# Patient Record
Sex: Female | Born: 1970 | Race: White | Hispanic: No | Marital: Married | Smoking: Light tobacco smoker
Health system: Southern US, Community
[De-identification: ages and names within clinical notes are randomized; demographics above are authoritative.]

## PROBLEM LIST (undated history)

## (undated) DIAGNOSIS — E119 Type 2 diabetes mellitus without complications: Secondary | ICD-10-CM

## (undated) DIAGNOSIS — I10 Essential (primary) hypertension: Secondary | ICD-10-CM

## (undated) DIAGNOSIS — R87619 Unspecified abnormal cytological findings in specimens from cervix uteri: Secondary | ICD-10-CM

## (undated) HISTORY — PX: GYNECOLOGIC CRYOSURGERY: SHX857

## (undated) HISTORY — PX: DILATION AND CURETTAGE OF UTERUS: SHX78

## (undated) HISTORY — DX: Type 2 diabetes mellitus without complications: E11.9

## (undated) HISTORY — DX: Unspecified abnormal cytological findings in specimens from cervix uteri: R87.619

## (undated) HISTORY — PX: ABDOMINAL HYSTERECTOMY: SHX81

## (undated) HISTORY — PX: WISDOM TOOTH EXTRACTION: SHX21

## (undated) HISTORY — DX: Essential (primary) hypertension: I10

---

## 2005-06-09 ENCOUNTER — Other Ambulatory Visit: Admission: RE | Admit: 2005-06-09 | Discharge: 2005-06-09 | Payer: Self-pay | Admitting: Gynecology

## 2011-06-27 ENCOUNTER — Other Ambulatory Visit: Payer: Self-pay | Admitting: Family Medicine

## 2011-06-27 DIAGNOSIS — Z1231 Encounter for screening mammogram for malignant neoplasm of breast: Secondary | ICD-10-CM

## 2011-07-04 ENCOUNTER — Ambulatory Visit
Admission: RE | Admit: 2011-07-04 | Discharge: 2011-07-04 | Disposition: A | Discharge status: Home / Self Care | Payer: BC Managed Care – PPO | Source: Ambulatory Visit | Attending: Family Medicine | Admitting: Family Medicine

## 2011-07-04 DIAGNOSIS — Z1231 Encounter for screening mammogram for malignant neoplasm of breast: Secondary | ICD-10-CM

## 2012-12-27 ENCOUNTER — Other Ambulatory Visit (HOSPITAL_COMMUNITY)
Admission: RE | Admit: 2012-12-27 | Discharge: 2012-12-27 | Disposition: A | Discharge status: Home / Self Care | Payer: BC Managed Care – PPO | Source: Ambulatory Visit | Attending: Family Medicine | Admitting: Family Medicine

## 2012-12-27 ENCOUNTER — Other Ambulatory Visit: Payer: Self-pay | Admitting: Physician Assistant

## 2012-12-27 DIAGNOSIS — Z01419 Encounter for gynecological examination (general) (routine) without abnormal findings: Secondary | ICD-10-CM | POA: Insufficient documentation

## 2014-10-08 ENCOUNTER — Ambulatory Visit
Admission: RE | Admit: 2014-10-08 | Discharge: 2014-10-08 | Disposition: A | Discharge status: Home / Self Care | Payer: Self-pay | Source: Ambulatory Visit | Attending: Family Medicine | Admitting: Family Medicine

## 2014-10-08 ENCOUNTER — Other Ambulatory Visit: Payer: Self-pay | Admitting: Family Medicine

## 2014-10-08 DIAGNOSIS — R1032 Left lower quadrant pain: Secondary | ICD-10-CM

## 2014-10-08 MED ORDER — IOHEXOL 300 MG/ML  SOLN
75.0000 mL | Freq: Once | INTRAMUSCULAR | Status: AC | PRN
  Administered 2014-10-08: 75 mL via INTRAVENOUS

## 2014-10-10 ENCOUNTER — Other Ambulatory Visit: Payer: Self-pay | Admitting: Family Medicine

## 2014-10-10 DIAGNOSIS — N838 Other noninflammatory disorders of ovary, fallopian tube and broad ligament: Secondary | ICD-10-CM

## 2014-10-13 ENCOUNTER — Ambulatory Visit
Admission: RE | Admit: 2014-10-13 | Discharge: 2014-10-13 | Disposition: A | Discharge status: Home / Self Care | Payer: BLUE CROSS/BLUE SHIELD | Source: Ambulatory Visit | Attending: Family Medicine | Admitting: Family Medicine

## 2014-10-13 DIAGNOSIS — N838 Other noninflammatory disorders of ovary, fallopian tube and broad ligament: Secondary | ICD-10-CM

## 2014-10-14 ENCOUNTER — Other Ambulatory Visit: Payer: Self-pay | Admitting: Family Medicine

## 2014-10-14 DIAGNOSIS — N83202 Unspecified ovarian cyst, left side: Secondary | ICD-10-CM

## 2014-11-25 ENCOUNTER — Ambulatory Visit
Admission: RE | Admit: 2014-11-25 | Discharge: 2014-11-25 | Disposition: A | Discharge status: Home / Self Care | Payer: BLUE CROSS/BLUE SHIELD | Source: Ambulatory Visit | Attending: Family Medicine | Admitting: Family Medicine

## 2014-11-25 DIAGNOSIS — N83202 Unspecified ovarian cyst, left side: Secondary | ICD-10-CM

## 2014-12-01 ENCOUNTER — Other Ambulatory Visit: Payer: Self-pay | Admitting: Family Medicine

## 2014-12-01 DIAGNOSIS — N838 Other noninflammatory disorders of ovary, fallopian tube and broad ligament: Secondary | ICD-10-CM

## 2014-12-10 ENCOUNTER — Ambulatory Visit
Admission: RE | Admit: 2014-12-10 | Discharge: 2014-12-10 | Disposition: A | Discharge status: Home / Self Care | Payer: BLUE CROSS/BLUE SHIELD | Source: Ambulatory Visit | Attending: Family Medicine | Admitting: Family Medicine

## 2014-12-10 DIAGNOSIS — N838 Other noninflammatory disorders of ovary, fallopian tube and broad ligament: Secondary | ICD-10-CM

## 2014-12-10 MED ORDER — GADOBENATE DIMEGLUMINE 529 MG/ML IV SOLN
16.0000 mL | Freq: Once | INTRAVENOUS | Status: AC | PRN
  Administered 2014-12-10: 16 mL via INTRAVENOUS

## 2014-12-16 ENCOUNTER — Other Ambulatory Visit: Payer: Self-pay | Admitting: Family Medicine

## 2014-12-16 DIAGNOSIS — N80129 Deep endometriosis of ovary, unspecified ovary: Secondary | ICD-10-CM

## 2014-12-16 DIAGNOSIS — N809 Endometriosis, unspecified: Secondary | ICD-10-CM

## 2014-12-23 ENCOUNTER — Encounter: Payer: Self-pay | Admitting: Obstetrics & Gynecology

## 2014-12-23 ENCOUNTER — Ambulatory Visit (INDEPENDENT_AMBULATORY_CARE_PROVIDER_SITE_OTHER): Payer: BLUE CROSS/BLUE SHIELD | Admitting: Obstetrics & Gynecology

## 2014-12-23 VITALS — BP 124/74 | HR 64 | Resp 16 | Ht 64.5 in | Wt 179.8 lb

## 2014-12-23 DIAGNOSIS — N801 Endometriosis of ovary: Secondary | ICD-10-CM | POA: Diagnosis not present

## 2014-12-23 DIAGNOSIS — N80129 Deep endometriosis of ovary, unspecified ovary: Secondary | ICD-10-CM

## 2014-12-23 DIAGNOSIS — Z124 Encounter for screening for malignant neoplasm of cervix: Secondary | ICD-10-CM

## 2014-12-23 NOTE — Progress Notes (Signed)
44 y.o. G2P2 MarriedCaucasianF here for consultation regarding left lower quadrant pain that has been present for greater than three months, enlarged uterus, and possible endometrioma noted on CT, ultrasound, and MRI.      Pt reports she has a long history of heavy and irregular cycles.  Pt was on OCPs in college and in her early 20's.  At one point, she recalls being on a higher dosage OCP and skipped her cycles for a year.  She had a lot of headaches with this and ultimately stopped.  She did not have trouble conceiving and had her first pregnancy at age 20 and second one age 69.  She had vaginal deliveries and both children were around 7 pounds each.  Pt reports about three to four months ago, she started experiencing increased LLQ pain as well as worsening pain with her cycles. Hse reports her flow isn't any different but continues to be heavy.  The pain is concentrated on the left with radiation down and around to the left front.  This is worse in the few days before her cycle but it present all the time.  She was prescribed Mobic for this but doesn't think it has helped so she is only taking tylenol and advil for the pain.    Pt reports she went to see Dr. Kathyrn Lass.  Evaluation included blood work showing an elevated WBC ct.  She did not have a fever at the time.  CT was done to rule out diverticulitis.  CT showed 7.4 x 5.3 x 5.7cm left, cystic adnexal mass.  Uterine fibroid was noted measuring 3.5cm as well. Pt was started on antibiotics due to the elevated WBC ct.  Pt stayed on this for a few days until ultrasound was done howing/confirming the left adnexal mass.    She also had an enlarged 16 week uterus.    Ultrasound was then repeated in early March confirming presence of an enlarging almost 8cm complex mass.  Then MRI was done 12/10/14 showing probable endometrioma.   She states she requested a referral for recommendations and is here to discuss options.  She is tired of being in pain and is  ready to proceed with some treatment other than monitoring the adnexal mass.     Patient's last menstrual period was 11/27/2014.          Sexually active: Yes.    The current method of family planning is vasectomy.    Exercising: Yes.    bootcamp and yoga Smoker:  Occasional cigar smoker  Health Maintenance: Pap:  2014 History of abnormal Pap:  Yes in college-cryosurgery MMG:  07/04/11-normal Colonoscopy:  none BMD:   none TDaP:  Will check with PCP Screening Labs: PCP, Hb today: PCP, Urine today: n/a   reports that she has been smoking Cigars.  She has never used smokeless tobacco. She reports that she drinks about 0.6 - 1.2 oz of alcohol per week. She reports that she does not use illicit drugs.  Past Medical History  Diagnosis Date  . Hypertension   . Abnormal Pap smear of cervix     in college-had cryosurgery    Past Surgical History  Procedure Laterality Date  . Wisdom tooth extraction    . Gynecologic cryosurgery      in college    Current Outpatient Prescriptions  Medication Sig Dispense Refill  . acetaminophen (TYLENOL) 500 MG tablet Take 500 mg by mouth every 6 (six) hours as needed.    . fexofenadine (  ALLEGRA) 180 MG tablet Take 180 mg by mouth daily.    Marland Kitchen ibuprofen (ADVIL,MOTRIN) 200 MG tablet Take 200 mg by mouth every 6 (six) hours as needed.    Marland Kitchen lisinopril-hydrochlorothiazide (PRINZIDE,ZESTORETIC) 10-12.5 MG per tablet   5  . Psyllium (METAMUCIL PO) Take by mouth daily.    . meloxicam (MOBIC) 15 MG tablet As needed  0   No current facility-administered medications for this visit.    Family History  Problem Relation Age of Onset  . Adopted: Yes    ROS:  Pertinent items are noted in HPI.  Otherwise, a comprehensive ROS was negative.  Exam:   BP 124/74 mmHg  Pulse 64  Resp 16  Ht 5' 4.5" (1.638 m)  Wt 179 lb 12.8 oz (81.557 kg)  BMI 30.40 kg/m2  LMP 11/27/2014     Height: 5' 4.5" (163.8 cm)  Ht Readings from Last 3 Encounters:  12/23/14 5' 4.5"  (1.638 m)    General appearance: alert, cooperative and appears stated age Head: Normocephalic, without obvious abnormality, atraumatic Neck: no adenopathy, supple, symmetrical, trachea midline and thyroid normal to inspection and palpation Breasts: normal appearance, no masses or tenderness Abdomen: soft, non-tender; bowel sounds normal; no masses,  no organomegaly Extremities: extremities normal, atraumatic, no cyanosis or edema Skin: Skin color, texture, turgor normal. No rashes or lesions Lymph nodes: Cervical, supraclavicular, and axillary nodes normal. No abnormal inguinal nodes palpated Neurologic: Grossly normal   Pelvic: External genitalia:  no lesions              Urethra:  normal appearing urethra with no masses, tenderness or lesions              Bartholins and Skenes: normal                 Vagina: normal appearing vagina with normal color and discharge, no lesions              Cervix: no lesions              Pap taken: Yes.   Bimanual Exam:  Uterus:  enlarged, 14 weeks, off to left, mobile weeks size              Adnexa: 8 cm firm mass left adnexa, tenderness to palpation, no mass on right               Rectovaginal: Confirms               Anus:  normal sphincter tone, no lesions  Chaperone was present for exam.  A:  8cm right endometrioma with enlarged 16cm uterus containing a 3.5cm fibroid and probable adenomyosis Hypertension Dysmenorrhea Menorrhagia  P:   With enlarged uterus and enlarging probable endometrioma, hysterectomy with LSO/possible BSO discussed.  Pt is not interested in OCPs or other hormonal methods.  She is also most interested in definitive therapy.  Surgery, recover, hospital stay, as well as risks including but not limited to bleeding, transfusion, DVT/PE, bowel/bladder/ureteral injury all discussed.  Pt also aware she may need to have both ovaries removed and be on HRT.  She is ok with all of this.   pap smear with HR HPV was obtained  today Ca-125 pending. Pt wants to discuss with her spouse and work and will call back for scheduling.

## 2014-12-24 ENCOUNTER — Telehealth: Payer: Self-pay | Admitting: Obstetrics & Gynecology

## 2014-12-24 ENCOUNTER — Telehealth: Payer: Self-pay | Admitting: Emergency Medicine

## 2014-12-24 LAB — CA 125: CA 125: 93 U/mL — ABNORMAL HIGH (ref <35)

## 2014-12-24 NOTE — Telephone Encounter (Signed)
Spoke with patient. Advised of benefit quote received for the surgeon portion of her surgery. Advised that payment is due in full at least 3 weeks prior to the scheduled surgery date. Patient agreeable. Advised patient that she will receive separate communication from the hospital regarding facility charges/fees/payment. Patient is agreeable with scheduling for 04.18.2016. Will notify Gay Filler.

## 2014-12-24 NOTE — Telephone Encounter (Signed)
Called [employer] Utilization Management to initiate prior auth for upcoming surgery. [id number].  Date:  03.30.2016 Time: 1219 Rep: Margarita Grizzle  CPT code: 27078 DX codes: N80.9/D25.9 Date of service: 04.18.2016 2 day surgery admit  Pending auth # [id number]  Was transferred to Nurse Phoebe Sharps who reviewed all case information with me and approved the surgery for a 2 day stay.  If additional days are needed, hospital will need to contact Case manager Devin J  [phone] ext [phone]. Also if additional days are needed, clinicals should be faxed to  [phone] Attn: auth# [id number].  Ref # for call is the auth #

## 2014-12-24 NOTE — Telephone Encounter (Signed)
Call to patient to give pre op instructions and schedule pre-op and post op appointments.  Patient given appointments and instructions. She is coming on 12/29/14 with husband for pre-op advised would need to confirm day of pre op regarding need for bowel prep if any.   Instructions given as follows:  Beginning 14 days before surgery, we ask that you do not take the following medications:  Aspirin, Vitamin E, NSAIDS (like Advil, Aleve, Motrin, Ibuprofen), Fish Oil, Herbal supplements.  You can take Tylenol or acetaminophen for any discomfort.  Do not take any other pain medications unless approved by your physician.  You may continue your regular multi-vitamin. No bowel prep needed.  DO NOT EAT OR DRINK ANYTHING AFTER MIDNIGHT ON THE NIGHT BEFORE YOUR SURGERY (INCLUDING WATER) UNLESS ADVISED TO DO SO BY THE HOSPITAL OR YOUR PHYSICIAN.  Dress casually the morning of surgery.  Do not take any valuables with you and do not wear makeup, jewelry or lotion.  You should not shave 48 hours prior to surgery.  Patient verbalized understanding of all instructions. She is scheduled for preop exam at [facility] hospital.

## 2014-12-26 ENCOUNTER — Telehealth: Payer: Self-pay | Admitting: Obstetrics & Gynecology

## 2014-12-26 NOTE — Telephone Encounter (Signed)
Disability forms received via fax. Forwarded to [employer].

## 2014-12-29 ENCOUNTER — Ambulatory Visit (INDEPENDENT_AMBULATORY_CARE_PROVIDER_SITE_OTHER): Payer: BLUE CROSS/BLUE SHIELD | Admitting: Obstetrics & Gynecology

## 2014-12-29 ENCOUNTER — Ambulatory Visit: Payer: BLUE CROSS/BLUE SHIELD | Admitting: Obstetrics & Gynecology

## 2014-12-29 VITALS — BP 120/70 | HR 60 | Resp 16 | Wt 178.2 lb

## 2014-12-29 DIAGNOSIS — N920 Excessive and frequent menstruation with regular cycle: Secondary | ICD-10-CM | POA: Diagnosis not present

## 2014-12-29 DIAGNOSIS — R102 Pelvic and perineal pain: Secondary | ICD-10-CM

## 2014-12-29 DIAGNOSIS — N801 Endometriosis of ovary: Secondary | ICD-10-CM

## 2014-12-29 DIAGNOSIS — N80129 Deep endometriosis of ovary, unspecified ovary: Secondary | ICD-10-CM

## 2014-12-29 LAB — IPS PAP TEST WITH HPV

## 2014-12-29 MED ORDER — OXYCODONE-ACETAMINOPHEN 5-325 MG PO TABS: 2.0000 | ORAL_TABLET | ORAL | Status: DC | PRN

## 2014-12-29 MED ORDER — IBUPROFEN 800 MG PO TABS: 800.0000 mg | ORAL_TABLET | Freq: Three times a day (TID) | ORAL | Status: DC | PRN

## 2014-12-29 NOTE — Telephone Encounter (Signed)
Routing to provider for final review. Patient agreeable to disposition. Will close encounter.     

## 2014-12-29 NOTE — Progress Notes (Signed)
44 y.o. G2P2 MarriedCaucasian female here for discussion of upcoming procedure.  Robotic assisted TLH with RSO, possible LSO, possible TAH, cystoscopy, pelvic washings planned due to 8cm left endometrioma on CT, ultrasound and MRI as well as menorrhagia and chronic pelvic pain.  Pt using around the close anti-inflammatory medications at this time.  Pre-op evaluation thus far has included CT, two ultrasounds and MRI (all ordered by her PCP).  MRI was done 12/10/14 and showed endometrioma with no concerns for malignancy.  Cas-125 is 93.  Pap done 12/23/14 was negative.    Spouse with pt today.  Procedure discussed with patient.  Hospital stay, recovery and pain management all discussed.  Risks discussed including but not limited to bleeding, 1% risk of receiving a  transfusion, infection, 3-4% risk of bowel/bladder/ureteral/vascular injury discussed as well as possible need for additional surgery if injury does occur discussed.  DVT/PE and rare risk of death discussed.  My actual complications with prior surgeries discussed.  Vaginal cuff dehiscence discussed.  Hernia formation discussed.  Positioning and incision locations discussed.  Patient aware if pathology abnormal she may need additional treatment.  As well, procedure will be started at laparoscopy but could be converted to laparotomy.  Both pt and spouse are comfortable with this plan.   All questions answered.       Ob Hx:   Patient's last menstrual period was 11/27/2014.          Sexually active: Yes.   Birth control: vasectomy Last pap: 12/23/14 WNL/negative HR HPV Last MMG:07/04/11 normal Tobacco: no smoking  Past Surgical History  Procedure Laterality Date  . Wisdom tooth extraction    . Gynecologic cryosurgery      in college  . Dilation and curettage of uterus      Dr. Janese Banks    Past Medical History  Diagnosis Date  . Hypertension   . Abnormal Pap smear of cervix     in college-had cryosurgery    Allergies: Review of patient's  allergies indicates no known allergies.  Current Outpatient Prescriptions  Medication Sig Dispense Refill  . acetaminophen (TYLENOL) 500 MG tablet Take 500 mg by mouth every 6 (six) hours as needed.    . fexofenadine (ALLEGRA) 180 MG tablet Take 180 mg by mouth daily.    Marland Kitchen ibuprofen (ADVIL,MOTRIN) 200 MG tablet Take 200 mg by mouth every 6 (six) hours as needed.    Marland Kitchen lisinopril-hydrochlorothiazide (PRINZIDE,ZESTORETIC) 10-12.5 MG per tablet   5  . Psyllium (METAMUCIL PO) Take by mouth daily.     No current facility-administered medications for this visit.    ROS: A comprehensive review of systems was negative except for: Genitourinary: positive for LLQ pain  Exam:    LMP 11/27/2014  General appearance: alert and cooperative Head: Normocephalic, without obvious abnormality, atraumatic Neck: no adenopathy, supple, symmetrical, trachea midline and thyroid not enlarged, symmetric, no tenderness/mass/nodules Lungs: clear to auscultation bilaterally Heart: regular rate and rhythm, S1, S2 normal, no murmur, click, rub or gallop Abdomen: soft, non-tender; bowel sounds normal; no masses,  no organomegaly Extremities: extremities normal, atraumatic, no cyanosis or edema Skin: Skin color, texture, turgor normal. No rashes or lesions Lymph nodes: Cervical, supraclavicular, and axillary nodes normal. no inguinal nodes palpated Neurologic: Grossly normal  Pelvic: exam was done 12/23/14 so was not repeated today  A: Endometriosis, Pelvic Pain, menorrhagia, 8cm left endometrioma     P:  Robotic TLH/LSO/possible RSO/possible right salpingectomy/cystoscopy/pelvic washings/possible TAH planned Rx for Motrin and Percocet given. Medications/Vitamins reviewed.  Pt knows needs to stop anti-inflammatories if she can. Hysterectomy brochure given for pre and post op instructions.  ~30 minutes spent with patient >50% of time was in face to face discussion of above.  This is only the second time I have  seen this pt and I met her husband for the first time today.  They had many and appropriate questions which were all answered.

## 2015-01-02 ENCOUNTER — Other Ambulatory Visit: Payer: Self-pay | Admitting: Obstetrics & Gynecology

## 2015-01-04 ENCOUNTER — Encounter: Payer: Self-pay | Admitting: Obstetrics & Gynecology

## 2015-01-04 DIAGNOSIS — N80129 Deep endometriosis of ovary, unspecified ovary: Secondary | ICD-10-CM | POA: Insufficient documentation

## 2015-01-04 DIAGNOSIS — N920 Excessive and frequent menstruation with regular cycle: Secondary | ICD-10-CM | POA: Insufficient documentation

## 2015-01-04 DIAGNOSIS — R102 Pelvic and perineal pain: Secondary | ICD-10-CM | POA: Insufficient documentation

## 2015-01-04 DIAGNOSIS — N801 Endometriosis of ovary: Secondary | ICD-10-CM | POA: Insufficient documentation

## 2015-01-05 ENCOUNTER — Other Ambulatory Visit: Payer: Self-pay | Admitting: Obstetrics & Gynecology

## 2015-01-05 ENCOUNTER — Telehealth: Payer: Self-pay | Admitting: *Deleted

## 2015-01-05 NOTE — Telephone Encounter (Signed)
Orders completed.  Ok to close encounter.  Thanks.

## 2015-01-05 NOTE — Telephone Encounter (Signed)
Amy from hospital PAT department called requesting orders for patient's surgery on 01-12-15. Has PAT appointment tomorrow.

## 2015-01-06 ENCOUNTER — Encounter (HOSPITAL_COMMUNITY): Payer: Self-pay

## 2015-01-06 ENCOUNTER — Encounter (HOSPITAL_COMMUNITY)
Admission: RE | Admit: 2015-01-06 | Discharge: 2015-01-06 | Disposition: A | Discharge status: Home / Self Care | Payer: BLUE CROSS/BLUE SHIELD | Source: Ambulatory Visit | Attending: Obstetrics & Gynecology | Admitting: Obstetrics & Gynecology

## 2015-01-06 ENCOUNTER — Other Ambulatory Visit: Payer: Self-pay

## 2015-01-06 DIAGNOSIS — D259 Leiomyoma of uterus, unspecified: Secondary | ICD-10-CM | POA: Insufficient documentation

## 2015-01-06 DIAGNOSIS — Z01818 Encounter for other preprocedural examination: Secondary | ICD-10-CM | POA: Insufficient documentation

## 2015-01-06 DIAGNOSIS — N809 Endometriosis, unspecified: Secondary | ICD-10-CM | POA: Insufficient documentation

## 2015-01-06 LAB — BASIC METABOLIC PANEL
Anion gap: 7 (ref 5–15)
BUN: 14 mg/dL (ref 6–23)
CO2: 26 mmol/L (ref 19–32)
Calcium: 9.3 mg/dL (ref 8.4–10.5)
Chloride: 104 mmol/L (ref 96–112)
Creatinine, Ser: 0.58 mg/dL (ref 0.50–1.10)
GFR calc Af Amer: >90 mL/min (ref >90)
GFR calc non Af Amer: >90 mL/min (ref >90)
GLUCOSE: 88 mg/dL (ref 70–99)
POTASSIUM: 3.8 mmol/L (ref 3.5–5.1)
SODIUM: 137 mmol/L (ref 135–145)

## 2015-01-06 LAB — CBC
HEMATOCRIT: 38.9 % (ref 36.0–46.0)
HEMOGLOBIN: 13.1 g/dL (ref 12.0–15.0)
MCH: 30.3 pg (ref 26.0–34.0)
MCHC: 33.7 g/dL (ref 30.0–36.0)
MCV: 90 fL (ref 78.0–100.0)
Platelets: 226 10*3/uL (ref 150–400)
RBC: 4.32 MIL/uL (ref 3.87–5.11)
RDW: 13.2 % (ref 11.5–15.5)
WBC: 8.2 10*3/uL (ref 4.0–10.5)

## 2015-01-06 NOTE — Patient Instructions (Signed)
Your procedure is scheduled on:01/12/15  Enter through the Main Entrance at :6am Pick up desk phone and dial [phone] and inform us of your arrival.  Please call [phone] if you have any problems the morning of surgery.  Remember: Do not eat food or drink liquids, including water, after midnight:Sunday   You may brush your teeth the morning of surgery.  Take these meds the morning of surgery with a sip of water- BP med  DO NOT wear jewelry, eye make-up, lipstick,body lotion, or dark fingernail polish.  (Polished toes are ok) You may wear deodorant.  If you are to be admitted after surgery, leave suitcase in car until your room has been assigned. Patients discharged on the day of surgery will not be allowed to drive home. Wear loose fitting, comfortable clothes for your ride home.

## 2015-01-11 MED ORDER — DEXTROSE 5 % IV SOLN
2.0000 g | INTRAVENOUS | Status: AC
  Administered 2015-01-12: 2 g via INTRAVENOUS
  Filled 2015-01-11: qty 2

## 2015-01-11 NOTE — Anesthesia Preprocedure Evaluation (Addendum)
Anesthesia Evaluation  Patient identified by MRN, date of birth, ID band Patient awake    Reviewed: Allergy & Precautions, NPO status , Patient's Chart, lab work & pertinent test results  History of Anesthesia Complications Negative for: history of anesthetic complications  Airway Mallampati: II  TM Distance: >3 FB Neck ROM: Full    Dental no notable dental hx. (+) Dental Advisory Given   Pulmonary Current Smoker (rare cigar use),  breath sounds clear to auscultation  Pulmonary exam normal       Cardiovascular hypertension, Pt. on medications Rhythm:Regular Rate:Normal     Neuro/Psych negative neurological ROS  negative psych ROS   GI/Hepatic negative GI ROS, Neg liver ROS,   Endo/Other  obesity  Renal/GU negative Renal ROS  negative genitourinary   Musculoskeletal negative musculoskeletal ROS (+)   Abdominal   Peds negative pediatric ROS (+)  Hematology negative hematology ROS (+)   Anesthesia Other Findings   Reproductive/Obstetrics negative OB ROS                            Anesthesia Physical Anesthesia Plan  ASA: II  Anesthesia Plan: General   Post-op Pain Management:    Induction: Intravenous  Airway Management Planned: Oral ETT  Additional Equipment:   Intra-op Plan:   Post-operative Plan: Extubation in OR  Informed Consent: I have reviewed the patients History and Physical, chart, labs and discussed the procedure including the risks, benefits and alternatives for the proposed anesthesia with the patient or authorized representative who has indicated his/her understanding and acceptance.   Dental advisory given  Plan Discussed with: CRNA  Anesthesia Plan Comments:         Anesthesia Quick Evaluation

## 2015-01-12 ENCOUNTER — Inpatient Hospital Stay (HOSPITAL_COMMUNITY)
Admission: RE | Admit: 2015-01-12 | Discharge: 2015-01-14 | DRG: 743 | Disposition: A | Discharge status: Home / Self Care | Payer: BLUE CROSS/BLUE SHIELD | Source: Ambulatory Visit | Attending: Obstetrics & Gynecology | Admitting: Obstetrics & Gynecology

## 2015-01-12 ENCOUNTER — Encounter (HOSPITAL_COMMUNITY)
Admission: RE | Disposition: A | Discharge status: Home / Self Care | Payer: Self-pay | Source: Ambulatory Visit | Attending: Obstetrics & Gynecology

## 2015-01-12 ENCOUNTER — Inpatient Hospital Stay (HOSPITAL_COMMUNITY): Payer: BLUE CROSS/BLUE SHIELD | Admitting: Anesthesiology

## 2015-01-12 ENCOUNTER — Encounter (HOSPITAL_COMMUNITY): Payer: Self-pay

## 2015-01-12 DIAGNOSIS — I10 Essential (primary) hypertension: Secondary | ICD-10-CM | POA: Diagnosis present

## 2015-01-12 DIAGNOSIS — N946 Dysmenorrhea, unspecified: Secondary | ICD-10-CM

## 2015-01-12 DIAGNOSIS — R102 Pelvic and perineal pain: Secondary | ICD-10-CM | POA: Diagnosis present

## 2015-01-12 DIAGNOSIS — N809 Endometriosis, unspecified: Secondary | ICD-10-CM | POA: Diagnosis present

## 2015-01-12 DIAGNOSIS — F17290 Nicotine dependence, other tobacco product, uncomplicated: Secondary | ICD-10-CM | POA: Diagnosis present

## 2015-01-12 DIAGNOSIS — N803 Endometriosis of pelvic peritoneum: Principal | ICD-10-CM | POA: Diagnosis present

## 2015-01-12 DIAGNOSIS — N920 Excessive and frequent menstruation with regular cycle: Secondary | ICD-10-CM | POA: Diagnosis present

## 2015-01-12 DIAGNOSIS — D259 Leiomyoma of uterus, unspecified: Secondary | ICD-10-CM | POA: Diagnosis present

## 2015-01-12 HISTORY — PX: CYSTOSCOPY: SHX5120

## 2015-01-12 HISTORY — PX: ROBOTIC ASSISTED TOTAL HYSTERECTOMY WITH BILATERAL SALPINGO OOPHERECTOMY: SHX6086

## 2015-01-12 LAB — PREGNANCY, URINE: Preg Test, Ur: NEGATIVE

## 2015-01-12 SURGERY — ROBOTIC ASSISTED TOTAL HYSTERECTOMY WITH BILATERAL SALPINGO OOPHORECTOMY
Anesthesia: General | Site: Urethra

## 2015-01-12 MED ORDER — FENTANYL CITRATE (PF) 250 MCG/5ML IJ SOLN
INTRAMUSCULAR | Status: AC
  Filled 2015-01-12: qty 5

## 2015-01-12 MED ORDER — SIMETHICONE 80 MG PO CHEW
80.0000 mg | CHEWABLE_TABLET | Freq: Four times a day (QID) | ORAL | Status: DC | PRN
  Administered 2015-01-13 – 2015-01-14 (×2): 80 mg via ORAL
  Filled 2015-01-12 (×2): qty 1

## 2015-01-12 MED ORDER — DEXTROSE-NACL 5-0.45 % IV SOLN
INTRAVENOUS | Status: DC
Start: 2015-01-12 — End: 2015-01-13
  Administered 2015-01-12: 15:00:00 via INTRAVENOUS

## 2015-01-12 MED ORDER — GLYCOPYRROLATE 0.2 MG/ML IJ SOLN
INTRAMUSCULAR | Status: DC | PRN
  Administered 2015-01-12: 0.1 mg via INTRAVENOUS
  Administered 2015-01-12: .4 mg via INTRAVENOUS
  Administered 2015-01-12: 0.1 mg via INTRAVENOUS

## 2015-01-12 MED ORDER — HYDROMORPHONE HCL 1 MG/ML IJ SOLN
INTRAMUSCULAR | Status: AC
  Filled 2015-01-12: qty 1

## 2015-01-12 MED ORDER — NEOSTIGMINE METHYLSULFATE 10 MG/10ML IV SOLN
INTRAVENOUS | Status: DC | PRN
  Administered 2015-01-12: 3 mg via INTRAVENOUS

## 2015-01-12 MED ORDER — MIDAZOLAM HCL 2 MG/2ML IJ SOLN
INTRAMUSCULAR | Status: AC
  Filled 2015-01-12: qty 2

## 2015-01-12 MED ORDER — GLYCOPYRROLATE 0.2 MG/ML IJ SOLN
INTRAMUSCULAR | Status: AC
  Filled 2015-01-12: qty 2

## 2015-01-12 MED ORDER — SODIUM CHLORIDE 0.9 % IJ SOLN
INTRAMUSCULAR | Status: AC
  Filled 2015-01-12: qty 30

## 2015-01-12 MED ORDER — PROPOFOL 10 MG/ML IV BOLUS
INTRAVENOUS | Status: AC
  Filled 2015-01-12: qty 20

## 2015-01-12 MED ORDER — FENTANYL CITRATE (PF) 100 MCG/2ML IJ SOLN
INTRAMUSCULAR | Status: AC
  Filled 2015-01-12: qty 2

## 2015-01-12 MED ORDER — ARTIFICIAL TEARS OP OINT
TOPICAL_OINTMENT | OPHTHALMIC | Status: AC
  Filled 2015-01-12: qty 3.5

## 2015-01-12 MED ORDER — DEXAMETHASONE SODIUM PHOSPHATE 4 MG/ML IJ SOLN
INTRAMUSCULAR | Status: DC | PRN
  Administered 2015-01-12: 4 mg via INTRAVENOUS

## 2015-01-12 MED ORDER — FENTANYL CITRATE (PF) 100 MCG/2ML IJ SOLN
25.0000 ug | INTRAMUSCULAR | Status: DC | PRN
  Administered 2015-01-12 (×2): 50 ug via INTRAVENOUS

## 2015-01-12 MED ORDER — HYDROCHLOROTHIAZIDE 12.5 MG PO CAPS
12.5000 mg | ORAL_CAPSULE | Freq: Every day | ORAL | Status: DC
  Administered 2015-01-13 – 2015-01-14 (×2): 12.5 mg via ORAL
  Filled 2015-01-12 (×2): qty 1

## 2015-01-12 MED ORDER — HYDROMORPHONE 0.3 MG/ML IV SOLN
INTRAVENOUS | Status: DC
  Administered 2015-01-12: 4.59 mg via INTRAVENOUS
  Administered 2015-01-12: 16 mg via INTRAVENOUS
  Administered 2015-01-12: 13:00:00 via INTRAVENOUS
  Administered 2015-01-13: 0.6 mg via INTRAVENOUS
  Administered 2015-01-13: 1.19 mg via INTRAVENOUS
  Filled 2015-01-12: qty 25

## 2015-01-12 MED ORDER — NEOSTIGMINE METHYLSULFATE 10 MG/10ML IV SOLN
INTRAVENOUS | Status: AC
  Filled 2015-01-12: qty 1

## 2015-01-12 MED ORDER — PANTOPRAZOLE SODIUM 40 MG IV SOLR
40.0000 mg | Freq: Every day | INTRAVENOUS | Status: DC
  Filled 2015-01-12: qty 40

## 2015-01-12 MED ORDER — PROPOFOL 10 MG/ML IV BOLUS
INTRAVENOUS | Status: DC | PRN
  Administered 2015-01-12: 160 mg via INTRAVENOUS

## 2015-01-12 MED ORDER — DEXAMETHASONE SODIUM PHOSPHATE 4 MG/ML IJ SOLN
INTRAMUSCULAR | Status: AC
  Filled 2015-01-12: qty 1

## 2015-01-12 MED ORDER — MENTHOL 3 MG MT LOZG: 1.0000 | LOZENGE | OROMUCOSAL | Status: DC | PRN

## 2015-01-12 MED ORDER — HYDROMORPHONE HCL 1 MG/ML IJ SOLN: 0.2500 mg | INTRAMUSCULAR | Status: DC | PRN

## 2015-01-12 MED ORDER — MIDAZOLAM HCL 2 MG/2ML IJ SOLN
INTRAMUSCULAR | Status: DC | PRN
  Administered 2015-01-12: 2 mg via INTRAVENOUS

## 2015-01-12 MED ORDER — ALUM & MAG HYDROXIDE-SIMETH 200-200-20 MG/5ML PO SUSP: 30.0000 mL | ORAL | Status: DC | PRN

## 2015-01-12 MED ORDER — LIDOCAINE HCL (CARDIAC) 20 MG/ML IV SOLN
INTRAVENOUS | Status: AC
  Filled 2015-01-12: qty 5

## 2015-01-12 MED ORDER — DIPHENHYDRAMINE HCL 50 MG/ML IJ SOLN
INTRAMUSCULAR | Status: AC
  Filled 2015-01-12: qty 1

## 2015-01-12 MED ORDER — ROPIVACAINE HCL 5 MG/ML IJ SOLN
INTRAMUSCULAR | Status: AC
  Filled 2015-01-12: qty 30

## 2015-01-12 MED ORDER — HYDROMORPHONE HCL 1 MG/ML IJ SOLN
INTRAMUSCULAR | Status: DC | PRN
  Administered 2015-01-12: 0.5 mg via INTRAVENOUS
  Administered 2015-01-12 (×3): .5 mg via INTRAVENOUS

## 2015-01-12 MED ORDER — ACETAMINOPHEN 325 MG PO TABS: 650.0000 mg | ORAL_TABLET | ORAL | Status: DC | PRN

## 2015-01-12 MED ORDER — ONDANSETRON HCL 4 MG/2ML IJ SOLN: 4.0000 mg | Freq: Once | INTRAMUSCULAR | Status: DC | PRN

## 2015-01-12 MED ORDER — PROMETHAZINE HCL 25 MG/ML IJ SOLN: 12.5000 mg | Freq: Four times a day (QID) | INTRAMUSCULAR | Status: DC | PRN

## 2015-01-12 MED ORDER — LISINOPRIL 10 MG PO TABS
10.0000 mg | ORAL_TABLET | Freq: Every day | ORAL | Status: DC
  Administered 2015-01-13 – 2015-01-14 (×2): 10 mg via ORAL
  Filled 2015-01-12 (×2): qty 1

## 2015-01-12 MED ORDER — ONDANSETRON HCL 4 MG/2ML IJ SOLN
INTRAMUSCULAR | Status: DC | PRN
  Administered 2015-01-12: 4 mg via INTRAVENOUS

## 2015-01-12 MED ORDER — BUPIVACAINE HCL (PF) 0.25 % IJ SOLN
INTRAMUSCULAR | Status: AC
  Filled 2015-01-12: qty 30

## 2015-01-12 MED ORDER — LISINOPRIL-HYDROCHLOROTHIAZIDE 10-12.5 MG PO TABS: 1.0000 | ORAL_TABLET | Freq: Every day | ORAL | Status: DC

## 2015-01-12 MED ORDER — OXYCODONE-ACETAMINOPHEN 5-325 MG PO TABS
1.0000 | ORAL_TABLET | ORAL | Status: DC | PRN
  Administered 2015-01-13 (×2): 2 via ORAL
  Administered 2015-01-13 – 2015-01-14 (×3): 1 via ORAL
  Filled 2015-01-12: qty 2
  Filled 2015-01-12 (×2): qty 1
  Filled 2015-01-12: qty 2
  Filled 2015-01-12: qty 1

## 2015-01-12 MED ORDER — ONDANSETRON HCL 4 MG/2ML IJ SOLN
INTRAMUSCULAR | Status: AC
  Filled 2015-01-12: qty 2

## 2015-01-12 MED ORDER — PHENYLEPHRINE 40 MCG/ML (10ML) SYRINGE FOR IV PUSH (FOR BLOOD PRESSURE SUPPORT)
PREFILLED_SYRINGE | INTRAVENOUS | Status: DC | PRN
  Administered 2015-01-12: 80 ug via INTRAVENOUS
  Administered 2015-01-12: 40 ug via INTRAVENOUS
  Administered 2015-01-12: 80 ug via INTRAVENOUS

## 2015-01-12 MED ORDER — ROCURONIUM BROMIDE 100 MG/10ML IV SOLN
INTRAVENOUS | Status: DC | PRN
  Administered 2015-01-12 (×2): 10 mg via INTRAVENOUS
  Administered 2015-01-12: 40 mg via INTRAVENOUS
  Administered 2015-01-12: 10 mg via INTRAVENOUS

## 2015-01-12 MED ORDER — ONDANSETRON HCL 4 MG/2ML IJ SOLN: 4.0000 mg | Freq: Four times a day (QID) | INTRAMUSCULAR | Status: DC | PRN

## 2015-01-12 MED ORDER — NALOXONE HCL 0.4 MG/ML IJ SOLN: 0.4000 mg | INTRAMUSCULAR | Status: DC | PRN

## 2015-01-12 MED ORDER — ACETAMINOPHEN 10 MG/ML IV SOLN
1000.0000 mg | Freq: Once | INTRAVENOUS | Status: AC
  Administered 2015-01-12: 1000 mg via INTRAVENOUS
  Filled 2015-01-12: qty 100

## 2015-01-12 MED ORDER — SCOPOLAMINE 1 MG/3DAYS TD PT72
MEDICATED_PATCH | TRANSDERMAL | Status: AC
  Administered 2015-01-12: 1.5 mg via TRANSDERMAL
  Filled 2015-01-12: qty 1

## 2015-01-12 MED ORDER — LACTATED RINGERS IR SOLN
Status: DC | PRN
  Administered 2015-01-12: 3000 mL

## 2015-01-12 MED ORDER — SCOPOLAMINE 1 MG/3DAYS TD PT72
1.0000 | MEDICATED_PATCH | Freq: Once | TRANSDERMAL | Status: DC
  Administered 2015-01-12: 1.5 mg via TRANSDERMAL

## 2015-01-12 MED ORDER — DIPHENHYDRAMINE HCL 50 MG/ML IJ SOLN
12.5000 mg | Freq: Four times a day (QID) | INTRAMUSCULAR | Status: DC | PRN
  Administered 2015-01-12 – 2015-01-13 (×2): 12.5 mg via INTRAVENOUS
  Filled 2015-01-12 (×2): qty 1

## 2015-01-12 MED ORDER — LACTATED RINGERS IV SOLN
INTRAVENOUS | Status: DC
  Administered 2015-01-12: 125 mL/h via INTRAVENOUS
  Administered 2015-01-12 (×2): via INTRAVENOUS

## 2015-01-12 MED ORDER — ACETAMINOPHEN 10 MG/ML IV SOLN
1000.0000 mg | Freq: Four times a day (QID) | INTRAVENOUS | Status: DC
  Administered 2015-01-12: 1000 mg via INTRAVENOUS
  Filled 2015-01-12 (×3): qty 100

## 2015-01-12 MED ORDER — SODIUM CHLORIDE 0.9 % IJ SOLN: 9.0000 mL | INTRAMUSCULAR | Status: DC | PRN

## 2015-01-12 MED ORDER — BUPIVACAINE LIPOSOME 1.3 % IJ SUSP
20.0000 mL | Freq: Once | INTRAMUSCULAR | Status: AC
  Administered 2015-01-12: 40 mL
  Filled 2015-01-12: qty 20

## 2015-01-12 MED ORDER — LIDOCAINE HCL (CARDIAC) 20 MG/ML IV SOLN
INTRAVENOUS | Status: DC | PRN
  Administered 2015-01-12: 60 mg via INTRAVENOUS

## 2015-01-12 MED ORDER — DIPHENHYDRAMINE HCL 12.5 MG/5ML PO ELIX: 12.5000 mg | ORAL_SOLUTION | Freq: Four times a day (QID) | ORAL | Status: DC | PRN

## 2015-01-12 MED ORDER — DIPHENHYDRAMINE HCL 50 MG/ML IJ SOLN
12.5000 mg | Freq: Once | INTRAMUSCULAR | Status: AC
  Administered 2015-01-12: 12.5 mg via INTRAVENOUS

## 2015-01-12 MED ORDER — ACETAMINOPHEN 500 MG PO TABS
1000.0000 mg | ORAL_TABLET | Freq: Four times a day (QID) | ORAL | Status: DC
  Filled 2015-01-12: qty 2

## 2015-01-12 MED ORDER — ROCURONIUM BROMIDE 100 MG/10ML IV SOLN
INTRAVENOUS | Status: AC
  Filled 2015-01-12: qty 1

## 2015-01-12 MED ORDER — ARTIFICIAL TEARS OP OINT
TOPICAL_OINTMENT | OPHTHALMIC | Status: DC | PRN
  Administered 2015-01-12: 1 via OPHTHALMIC

## 2015-01-12 MED ORDER — FENTANYL CITRATE (PF) 100 MCG/2ML IJ SOLN
INTRAMUSCULAR | Status: DC | PRN
  Administered 2015-01-12: 50 ug via INTRAVENOUS
  Administered 2015-01-12: 100 ug via INTRAVENOUS
  Administered 2015-01-12 (×3): 50 ug via INTRAVENOUS

## 2015-01-12 MED ORDER — STERILE WATER FOR IRRIGATION IR SOLN
Status: DC | PRN
  Administered 2015-01-12: 3000 mL

## 2015-01-12 SURGICAL SUPPLY — 86 items
APL SKNCLS STERI-STRIP NONHPOA (GAUZE/BANDAGES/DRESSINGS) ×3
BARRIER ADHS 3X4 INTERCEED (GAUZE/BANDAGES/DRESSINGS) ×5 IMPLANT
BENZOIN TINCTURE PRP APPL 2/3 (GAUZE/BANDAGES/DRESSINGS) ×5 IMPLANT
BRR ADH 4X3 ABS CNTRL BYND (GAUZE/BANDAGES/DRESSINGS) ×3
CANISTER SUCT 3000ML (MISCELLANEOUS) ×5 IMPLANT
CLOSURE WOUND 1/2 X4 (GAUZE/BANDAGES/DRESSINGS)
CLOSURE WOUND 1/4X4 (GAUZE/BANDAGES/DRESSINGS) ×1
CLOTH BEACON ORANGE TIMEOUT ST (SAFETY) ×5 IMPLANT
CONT PATH 16OZ SNAP LID 3702 (MISCELLANEOUS) ×5 IMPLANT
COVER BACK TABLE 60X90IN (DRAPES) ×10 IMPLANT
COVER TIP SHEARS 8 DVNC (MISCELLANEOUS) ×3 IMPLANT
COVER TIP SHEARS 8MM DA VINCI (MISCELLANEOUS) ×2
DECANTER SPIKE VIAL GLASS SM (MISCELLANEOUS) ×5 IMPLANT
DRAPE WARM FLUID 44X44 (DRAPE) ×5 IMPLANT
DRSG COVADERM PLUS 2X2 (GAUZE/BANDAGES/DRESSINGS) ×20 IMPLANT
DRSG OPSITE POSTOP 3X4 (GAUZE/BANDAGES/DRESSINGS) ×5 IMPLANT
DRSG OPSITE POSTOP 4X10 (GAUZE/BANDAGES/DRESSINGS) ×5 IMPLANT
DRSG OPSITE POSTOP 4X12 (GAUZE/BANDAGES/DRESSINGS) ×3 IMPLANT
DRSG TELFA 3X8 NADH (GAUZE/BANDAGES/DRESSINGS) ×5 IMPLANT
DURAPREP 26ML APPLICATOR (WOUND CARE) ×5 IMPLANT
ELECT REM PT RETURN 9FT ADLT (ELECTROSURGICAL) ×5
ELECTRODE REM PT RTRN 9FT ADLT (ELECTROSURGICAL) ×3 IMPLANT
GAUZE SPONGE 4X4 16PLY XRAY LF (GAUZE/BANDAGES/DRESSINGS) ×5 IMPLANT
GAUZE VASELINE 3X9 (GAUZE/BANDAGES/DRESSINGS) IMPLANT
GLOVE BIOGEL PI IND STRL 7.0 (GLOVE) ×12 IMPLANT
GLOVE BIOGEL PI INDICATOR 7.0 (GLOVE) ×8
GLOVE ECLIPSE 6.5 STRL STRAW (GLOVE) ×20 IMPLANT
KIT ACCESSORY DA VINCI DISP (KITS) ×2
KIT ACCESSORY DVNC DISP (KITS) ×3 IMPLANT
LEGGING LITHOTOMY PAIR STRL (DRAPES) ×5 IMPLANT
LIQUID BAND (GAUZE/BANDAGES/DRESSINGS) ×5 IMPLANT
NEEDLE HYPO 22GX1.5 SAFETY (NEEDLE) ×5 IMPLANT
NEEDLE INSUFFLATION 120MM (ENDOMECHANICALS) ×5 IMPLANT
NS IRRIG 1000ML POUR BTL (IV SOLUTION) ×5 IMPLANT
OCCLUDER COLPOPNEUMO (BALLOONS) ×3 IMPLANT
PACK ABDOMINAL GYN (CUSTOM PROCEDURE TRAY) ×5 IMPLANT
PACK ROBOT WH (CUSTOM PROCEDURE TRAY) ×5 IMPLANT
PACK ROBOTIC GOWN (GOWN DISPOSABLE) ×5 IMPLANT
PAD ABD 7.5X8 STRL (GAUZE/BANDAGES/DRESSINGS) ×3 IMPLANT
PAD DRESSING TELFA 3X8 NADH (GAUZE/BANDAGES/DRESSINGS) IMPLANT
PAD OB MATERNITY 4.3X12.25 (PERSONAL CARE ITEMS) ×5 IMPLANT
PAD POSITIONER PINK NONSTERILE (MISCELLANEOUS) ×5 IMPLANT
PAD PREP 24X48 CUFFED NSTRL (MISCELLANEOUS) ×10 IMPLANT
PROTECTOR NERVE ULNAR (MISCELLANEOUS) ×5 IMPLANT
SET BI-LUMEN FLTR TB AIRSEAL (TUBING) ×5 IMPLANT
SET CYSTO W/LG BORE CLAMP LF (SET/KITS/TRAYS/PACK) ×5 IMPLANT
SET IRRIG TUBING LAPAROSCOPIC (IRRIGATION / IRRIGATOR) ×5 IMPLANT
SPONGE GAUZE 2X2 8PLY STER LF (GAUZE/BANDAGES/DRESSINGS)
SPONGE GAUZE 2X2 8PLY STRL LF (GAUZE/BANDAGES/DRESSINGS) IMPLANT
SPONGE GAUZE 4X4 12PLY STER LF (GAUZE/BANDAGES/DRESSINGS) ×10 IMPLANT
SPONGE LAP 18X18 X RAY DECT (DISPOSABLE) IMPLANT
SPONGE SURGIFOAM ABS GEL 12-7 (HEMOSTASIS) IMPLANT
STRIP CLOSURE SKIN 1/2X4 (GAUZE/BANDAGES/DRESSINGS) IMPLANT
STRIP CLOSURE SKIN 1/4X4 (GAUZE/BANDAGES/DRESSINGS) ×4 IMPLANT
SUT PDS AB 0 CTX 60 (SUTURE) ×3 IMPLANT
SUT PROLENE 6 0 C 1 24 (SUTURE) ×3 IMPLANT
SUT VIC AB 0 CT1 18XCR BRD8 (SUTURE) ×9 IMPLANT
SUT VIC AB 0 CT1 27 (SUTURE) ×25
SUT VIC AB 0 CT1 27XBRD ANBCTR (SUTURE) ×15 IMPLANT
SUT VIC AB 0 CT1 8-18 (SUTURE) ×15
SUT VIC AB 2-0 CT1 27 (SUTURE) ×10
SUT VIC AB 2-0 CT1 TAPERPNT 27 (SUTURE) ×5 IMPLANT
SUT VIC AB 3-0 PS2 18 (SUTURE) ×9 IMPLANT
SUT VIC AB 3-0 PS2 18XBRD (SUTURE) ×3 IMPLANT
SUT VICRYL 0 TIES 12 18 (SUTURE) ×5 IMPLANT
SUT VICRYL 0 UR6 27IN ABS (SUTURE) ×5 IMPLANT
SUT VICRYL RAPIDE 3 0 (SUTURE) ×3 IMPLANT
SUT VICRYL RAPIDE 4/0 PS 2 (SUTURE) ×10 IMPLANT
SUT VLOC 180 0 9IN  GS21 (SUTURE) ×2
SUT VLOC 180 0 9IN GS21 (SUTURE) ×3 IMPLANT
SYR 50ML LL SCALE MARK (SYRINGE) ×5 IMPLANT
SYSTEM CONVERTIBLE TROCAR (TROCAR) IMPLANT
TIP RUMI ORANGE 6.7MMX12CM (TIP) ×3 IMPLANT
TIP UTERINE 5.1X6CM LAV DISP (MISCELLANEOUS) IMPLANT
TIP UTERINE 6.7X10CM GRN DISP (MISCELLANEOUS) ×3 IMPLANT
TIP UTERINE 6.7X6CM WHT DISP (MISCELLANEOUS) IMPLANT
TIP UTERINE 6.7X8CM BLUE DISP (MISCELLANEOUS) IMPLANT
TOWEL OR 17X24 6PK STRL BLUE (TOWEL DISPOSABLE) ×10 IMPLANT
TRAY FOLEY CATH 14FR (SET/KITS/TRAYS/PACK) ×2 IMPLANT
TRAY FOLEY CATH SILVER 14FR (SET/KITS/TRAYS/PACK) ×5 IMPLANT
TROCAR DILATING TIP 12MM 150MM (ENDOMECHANICALS) ×5 IMPLANT
TROCAR DISP BLADELESS 8 DVNC (TROCAR) ×3 IMPLANT
TROCAR DISP BLADELESS 8MM (TROCAR) ×2
TROCAR XCEL NON-BLD 5MMX100MML (ENDOMECHANICALS) ×5 IMPLANT
WARMER LAPAROSCOPE (MISCELLANEOUS) ×5 IMPLANT
WATER STERILE IRR 1000ML POUR (IV SOLUTION) ×15 IMPLANT

## 2015-01-12 NOTE — Anesthesia Postprocedure Evaluation (Signed)
  Anesthesia Post-op Note  Patient: Tracie Conway  Procedure(s) Performed: Procedure(s): TOTAL ABDOMINAL HYSTERECTOMY WITH BILATERAL  SALPINGO OOPHORECTOMY,  (Bilateral) CYSTOSCOPY (N/A)  Patient Location: [facility]  Anesthesia Type:General  Level of Consciousness: awake, alert , oriented and patient cooperative  Airway and Oxygen Therapy: Patient Spontanous Breathing and Patient connected to nasal cannula oxygen  Post-op Pain: none  Post-op Assessment: Post-op Vital signs reviewed, Patient's Cardiovascular Status Stable, Respiratory Function Stable, Patent Airway and No signs of Nausea or vomiting  Post-op Vital Signs: Reviewed and stable  Last Vitals:  Filed Vitals:   01/12/15 1247  BP: 137/86  Pulse: 83  Temp: 36.9 C  Resp: 14    Complications: No apparent anesthesia complications

## 2015-01-12 NOTE — H&P (Signed)
Tracie Conway is an 44 y.o. female  G2P2 MWF with 8 cm endometriom and pelvic pain that has been present for several months with accompanying heavy menstrual cycles here for definitive surgery.  Pre operative evaluated has included CT scan, PUS, and pelvic MRI.  MRI showed no evidence of malignancy.  Ca-125 was elevated at 93.  Pt has been counseled about alternatives including Depo Lupron, removal of just the left ovary, or continued monitoring but she is ready to proceed with definitive surgery.  Due to mobility on physical exam, feel it is appropriate to at least begin laparoscopically.  Pt is prepared for possible open procedure as well.  Risks and benefits have been discussed in the office and she is ready to proceed.  Pertinent Gynecological History: Menses: regular every month without intermenstrual spotting and flow is heavy Bleeding: menorrhagia Contraception: vasectomy DES exposure: denies Blood transfusions: none Sexually transmitted diseases: no past history Previous GYN Procedures: none  Last mammogram: normal Date: 10/12--aware it is due Last pap: normal Date: 12/23/14  OB History: G2, P2    Past Medical History  Diagnosis Date  . Hypertension   . Abnormal Pap smear of cervix     in college-had cryosurgery    Past Surgical History  Procedure Laterality Date  . Wisdom tooth extraction    . Gynecologic cryosurgery      in college  . Dilation and curettage of uterus      Dr. Janese Banks    Family History  Problem Relation Age of Onset  . Adopted: Yes    Social History:  reports that she has been smoking Cigars.  She has never used smokeless tobacco. She reports that she drinks about 0.6 - 1.2 oz of alcohol per week. She reports that she does not use illicit drugs.  Allergies: No Known Allergies  No prescriptions prior to admission    Review of Systems  All other systems reviewed and are negative.   There were no vitals taken for this visit. Physical Exam   Constitutional: She is oriented to person, place, and time. She appears well-developed and well-nourished.  Neck: Normal range of motion. No thyromegaly present.  Cardiovascular: Normal rate and regular rhythm.   Respiratory: Effort normal and breath sounds normal.  Neurological: She is alert and oriented to person, place, and time.  Skin: Skin is warm and dry.  Psychiatric: She has a normal mood and affect.    No results found for this or any previous visit (from the past 24 hour(s)).  No results found.  Assessment/Plan: 44 yo G2P2 MWF here for robotic assisted TLH, LSO with possible RSO or right salpingectomy, pelvic washings, cystoscopy, and possible TAH.  Pt aware I will start laparoscopically and proceed from that starting point.  She is comfortable with all of the above plan.  Risks and benefits have been explained and she is desirous to proceed.  Tracie Conway 01/12/2015, 12:00 AM

## 2015-01-12 NOTE — Transfer of Care (Signed)
Immediate Anesthesia Transfer of Care Note  Patient: Tracie Conway  Procedure(s) Performed: Procedure(s): TOTAL ABDOMINAL HYSTERECTOMY WITH BILATERAL  SALPINGO OOPHORECTOMY,  (Bilateral) CYSTOSCOPY (N/A)  Patient Location: PACU  Anesthesia Type:General  Level of Consciousness: awake, alert  and oriented  Airway & Oxygen Therapy: Patient Spontanous Breathing and Patient connected to nasal cannula oxygen  Post-op Assessment: Report given to RN and Post -op Vital signs reviewed and stable  Post vital signs: Reviewed and stable  Last Vitals:  Filed Vitals:   01/12/15 0624  BP: 113/80  Pulse: 58  Temp: 36.8 C  Resp: 16    Complications: No apparent anesthesia complications

## 2015-01-12 NOTE — Progress Notes (Signed)
Day of Surgery Procedure(s) (LRB): TOTAL ABDOMINAL HYSTERECTOMY WITH BILATERAL  SALPINGO OOPHORECTOMY,  (Bilateral) CYSTOSCOPY (N/A)  Subjective: Patient reports good pain control.  No nausea.  Wants to eat.  Has walked.  D/w pt surgery and findings.  Aware both tubes and ovaries were removed.  All questions answered.  Objective: I have reviewed patient's vital signs, intake and output, medications and labs.  General: alert and cooperative Resp: clear to auscultation bilaterally Cardio: regular rate and rhythm, S1, S2 normal, no murmur, click, rub or gallop GI: normal findings: soft, non-tender and occ BS noted and incision: clean, dry and intact Extremities: extremities normal, atraumatic, no cyanosis or edema and SCDs on and functioning properly Vaginal Bleeding: none  Assessment: s/p Procedure(s): TOTAL ABDOMINAL HYSTERECTOMY WITH BILATERAL  SALPINGO OOPHORECTOMY,  (Bilateral) CYSTOSCOPY (N/A): stable  Plan: Advance diet Encourage ambulation  Plan foley removal in AM  LOS: 0 days   Pt was seen around 6pm tonight but note was completed later as I was needed in the OR for assistance on a surgical case.  Tracie Conway SUZANNE 01/12/2015, 11:26 PM

## 2015-01-12 NOTE — Addendum Note (Signed)
Addendum  created 01/12/15 1442 by Raenette Rover, CRNA   Modules edited: Notes Section   Notes Section:  File: [id number]

## 2015-01-12 NOTE — Anesthesia Procedure Notes (Signed)
Procedure Name: Intubation Date/Time: 01/12/2015 7:31 AM Performed by: Elenore Paddy Pre-anesthesia Checklist: Patient identified, Emergency Drugs available, Suction available and Patient being monitored Patient Re-evaluated:Patient Re-evaluated prior to inductionOxygen Delivery Method: Circle system utilized Preoxygenation: Pre-oxygenation with 100% oxygen Intubation Type: IV induction Ventilation: Mask ventilation without difficulty Laryngoscope Size: Mac and 3 Grade View: Grade I Tube type: Oral Tube size: 7.0 mm Number of attempts: 1 Airway Equipment and Method: Stylet Placement Confirmation: ETT inserted through vocal cords under direct vision,  positive ETCO2 and breath sounds checked- equal and bilateral Secured at: 20 cm Tube secured with: Tape Dental Injury: Teeth and Oropharynx as per pre-operative assessment

## 2015-01-12 NOTE — OR Nursing (Signed)
Patient"s abdomen is red and has raised abrasions with some bleeding noted.  Red raised abrasions resulted from patient prep in pre-op from clipping per Dr. Sabra Heck and Leonia Reader RN.  Dr. Sabra Heck and Dr Quincy Simmonds notified before procedure started.  Dr. Quincy Simmonds and Dr. Sabra Heck assessed and collaborated before any procedure performed.  They then decided to do and open abdominal procedure instead of Robotic procedure which lowered the patients risk for infection related to red raised abrasions.

## 2015-01-12 NOTE — Op Note (Signed)
01/12/2015  11:52 AM  PATIENT:  Tracie Conway  44 y.o. female  PRE-OPERATIVE DIAGNOSIS: Menorrhagia, 8cm endometrioma, enlarged fibroid uterus, pelvic pain in female  POST-OPERATIVE DIAGNOSIS:  Same and stage 4 endometriosis  PROCEDURE:  Procedure(s): TOTAL ABDOMINAL HYSTERECTOMY WITH BILATERAL  SALPINGO OOPHORECTOMY,  CYSTOSCOPY  SURGEON:  Jhalen Eley SUZANNE  ASSISTANTS: Josefa Half, MD   ANESTHESIA:   general  ESTIMATED BLOOD LOSS: 150cc  BLOOD ADMINISTERED:none   FLUIDS: 2000ccLR  UOP: 800cc clear UOP  SPECIMEN:  Uterus, bilateral tubes and ovaries  DISPOSITION OF SPECIMEN:  PATHOLOGY  FINDINGS: Large 8cm left endometrioma with significant adhesions to left pelvic sidewall and posterior aspect of uterus, obliterated posterior cul de sac, right ovary adhered to posterior aspect of uterus.  Mild bilateral hydrosalpinx due to adhesions.  DESCRIPTION OF OPERATION: Patient is taken to the operating room. She is placed in the supine position. She is a running IV in place. Informed consent was present on the chart. SCDs on her lower extremities and functioning properly. General endotracheal anesthesia was administered by the anesthesia staff without difficulty. Once adequate anesthesia was confirmed the legs are placed in the low lithotomy position in [place].  Chlor prep was then used to prep the abdomen and Betadine was used to prep the inner thighs, perineum and vagina. Once 3 minutes had past the patient was draped in a normal standard fashion. A foley catheter was placed to straight drain and kept sterile on the field.   Initial plan was to attempt a laparoscopic procedure.  Pt was aware, I would assess under anesthesia and proceed if I thought it was possible.  Exam under anesthesia confirmed uterus that was mobile in the midline but not on either side of the uterus.  Also, the uterus was much larger than ultrasound (not done in my office) had measured.  Attention was  turned to the vaginal heavy weighted speculum placed.  Using curved deaver, the anterior lip of cervix was seen and grasped with single toothed tenaculum.  The uterus was very anteflexed, more so than I noted in office.  Uterus sounded to 16 cm which was much larger than noted on ultrasound or in office.  Due to size of uterus and decreased mobility, decided to go ahead and proceed with open procedure.    A midline skin incision was made and carried down through the subcutaneous fat and tissue. The fascia was identified and nicked in the midline. The fascial incision was extended superiorly and inferiorly.  The peritoneum was identified and opened sharply. The peritoneum was extended superiorly and inferiorly down to the level of the bladder.  The uterus could be lifted some but there were dense adhesions to the endometrioma and posterior cul de sac.   No large retractor was used during the case. The bowel could not be packed due to posterior adhesions to the cul de sac so one was gently used to get some of the bowel up and out of the way.    Using a curved Deaver retractor and with uterus on stretch the right round ligament was suture-ligated and incised. The posterior leaf of the broad ligament was opened. The IP ligament on the right side was isolated after identification of the ureter.  Ovary was adhered to uterus so it was kept with the specimen. Using a Heaney clamp, the IP ligament was doubly clamped. It was then incised and suture tied first with a free-tie of #0 and then with a stich #0 Vicryl.  The inferior leaf of the broad ligament was opened. The bladder flap was taken down without difficulty.    Attention was turned to the left side. The left round ligement was suture-ligated and incised.  The superior leaf of the broad ligament was opened.  Ureter was identified.  The endometrioma was carefully dissected off the left side wall and from the posterior cul de sac.  Once the posterior cul de sac  adhesions were dissected, there was much more mobility.  As well, there was no significant bowel adhesions noted.  The IP ligament was isolated and was clamped cut and doubly suture-ligated with a free-tie of #0 Vicryl and a Heaney stitch of #0 Vicryl. The inferior leaf of the broad ligament opened and additional dissection of the baldder flap was performed.    Once the bladder was below the level of the left uterine artery was skeletonized. It was then clamped. Backbleeding was clamped as well. The pedicle was incised and then doubly suture-ligated with #0 Vicryl.   In a similar fashion, the right uterine artery was skeletonized, clamped, cut, and doubly suture-ligated with #0 Vicryl.   The cardinal ligament were serially clamped cut and suture-ligated with #0 Vicryl using straight Heaney clamps including close to the cervix. Further bladder dissection was performed as necessary.  At the base of the cervix a curved Heaney clamp was placed on each side. These pedicles were cut and suture-ligated with #0 Vicryl. Care was taken to ensure the corner was adequately sutured. Then the remaining vaginal mucosa was incised close to the cervix as the colpotomy was completed, freeing the specimen. Then the vagina was closed with additional figure-of-eight sutures of #0 Vicryl. The pelvis was irrigated. There were several small areas of minor bleeding which were made hemostatic with cautery. Because of the extensive dissection on the left side, cysto was planned at the end of the procedure.   Pelvis was irrigated again.  Excellent hemostasis was noted.  Intercede was placed across the cuff.  Upper abdomen was explored and no abnormal findings were noted.  Appendix was seen and was normal.    All instruments were removed. The laparotomy sponge was removed. The peritoneum was closed. The fascia was then closed from superior edge to inferior edge with double stranded Proline suture.  Exparel was then injected into the  subcutaneous--40 cc total (20cc Exparel and 20cc 0.25% Marcaine).  The subcutaneous fat tissues reapproximated with interrupted sutures of plain gut. Finally the skin was closed with subcuticular stitch of 3-0 Vicryl. Steri-Strips were applied to the incision in a BD pad with paper tape was applied across this to complete the dressing.   Cystoscopy was then performed.  Intact bladder and normal urine jets from both ureteral orifices were noted.  The Foley catheter was replaced and the bladder decompressed.  Pt tolerated procedure well. Legs are positioned back to the supine position. Sponge, lap, needle, initially counts were correct x2. She was awakened from anesthesia, extubated and taken to recovery in stable condition.   COUNTS:  YES  PLAN OF CARE: Transfer to PACU

## 2015-01-12 NOTE — Anesthesia Postprocedure Evaluation (Signed)
  Anesthesia Post-op Note  Patient: Tracie Conway  Procedure(s) Performed: Procedure(s) (LRB): TOTAL ABDOMINAL HYSTERECTOMY WITH BILATERAL  SALPINGO OOPHORECTOMY,  (Bilateral) CYSTOSCOPY (N/A)  Patient Location: PACU  Anesthesia Type: General  Level of Consciousness: awake and alert   Airway and Oxygen Therapy: Patient Spontanous Breathing  Post-op Pain: mild  Post-op Assessment: Post-op Vital signs reviewed, Patient's Cardiovascular Status Stable, Respiratory Function Stable, Patent Airway and No signs of Nausea or vomiting  Last Vitals:  Filed Vitals:   01/12/15 1119  BP: 124/68  Pulse: 75  Temp: 37 C  Resp: 22    Post-op Vital Signs: stable   Complications: No apparent anesthesia complications

## 2015-01-13 ENCOUNTER — Encounter (HOSPITAL_COMMUNITY): Payer: Self-pay | Admitting: Obstetrics & Gynecology

## 2015-01-13 LAB — CBC
HCT: 35.7 % — ABNORMAL LOW (ref 36.0–46.0)
Hemoglobin: 12 g/dL (ref 12.0–15.0)
MCH: 30.6 pg (ref 26.0–34.0)
MCHC: 33.6 g/dL (ref 30.0–36.0)
MCV: 91.1 fL (ref 78.0–100.0)
PLATELETS: 211 10*3/uL (ref 150–400)
RBC: 3.92 MIL/uL (ref 3.87–5.11)
RDW: 13.1 % (ref 11.5–15.5)
WBC: 11.9 10*3/uL — AB (ref 4.0–10.5)

## 2015-01-13 LAB — BASIC METABOLIC PANEL
Anion gap: 5 (ref 5–15)
CO2: 28 mmol/L (ref 19–32)
Calcium: 8.3 mg/dL — ABNORMAL LOW (ref 8.4–10.5)
Chloride: 103 mmol/L (ref 96–112)
Creatinine, Ser: 0.59 mg/dL (ref 0.50–1.10)
GFR calc Af Amer: >90 mL/min (ref >90)
GFR calc non Af Amer: >90 mL/min (ref >90)
Glucose, Bld: 125 mg/dL — ABNORMAL HIGH (ref 70–99)
POTASSIUM: 3.5 mmol/L (ref 3.5–5.1)
SODIUM: 136 mmol/L (ref 135–145)

## 2015-01-13 MED ORDER — PANTOPRAZOLE SODIUM 40 MG PO TBEC: 40.0000 mg | DELAYED_RELEASE_TABLET | Freq: Every day | ORAL | Status: DC

## 2015-01-13 MED ORDER — DIPHENHYDRAMINE HCL 25 MG PO CAPS
25.0000 mg | ORAL_CAPSULE | Freq: Four times a day (QID) | ORAL | Status: DC | PRN
  Administered 2015-01-13: 25 mg via ORAL
  Filled 2015-01-13: qty 1

## 2015-01-13 NOTE — Progress Notes (Signed)
The patient is receiving protonix by the intravenous route.  Based on criteria approved by the Pharmacy and [place], the medication is being converted to the equivalent oral dose form.  These criteria include: -No Active GI bleeding -Able to tolerate diet of full liquids (or better) or tube feeding -Able to tolerate other medications by the oral or enteral route  If you have any questions about this conversion, please contact the Pharmacy Department (ext [phone]).  Thank you.  Beryle Lathe 01/13/2015

## 2015-01-13 NOTE — Progress Notes (Signed)
1 Day Post-Op Procedure(s) (LRB): TOTAL ABDOMINAL HYSTERECTOMY WITH BILATERAL  SALPINGO OOPHORECTOMY,  (Bilateral) CYSTOSCOPY (N/A)  Subjective: Patient reports good pain control.  Was itching with the Dilaudid PCA but this has improved with Percocet.  Voiding easily.  No nausea.  On regular diet.  Having some right should pain and gas pains.  Objective: I have reviewed patient's vital signs, intake and output, medications and labs.  General: alert and cooperative Resp: clear to auscultation bilaterally Cardio: regular rate and rhythm, S1, S2 normal, no murmur, click, rub or gallop GI: soft, non-tender; bowel sounds normal; no masses,  no organomegaly and incision: clean, dry and intact Extremities: extremities normal, atraumatic, no cyanosis or edema Vaginal Bleeding: none  Assessment: s/p Procedure(s): TOTAL ABDOMINAL HYSTERECTOMY WITH BILATERAL  SALPINGO OOPHORECTOMY,  (Bilateral) CYSTOSCOPY (N/A): stable and progressing well  Plan: Continue current care.  Hopeful d/c in AM.  LOS: 1 day    Hale Bogus SUZANNE 01/13/2015, 6:40 PM

## 2015-01-13 NOTE — Progress Notes (Signed)
1 Day Post-Op Procedure(s) (LRB): TOTAL ABDOMINAL HYSTERECTOMY WITH BILATERAL  SALPINGO OOPHORECTOMY,  (Bilateral) CYSTOSCOPY (N/A)  Subjective: Patient reports good pain control.  No nausea.  Ate regular food last night.  Some burping yesterday.  Walked yesterday and catheter already out.  Objective: I have reviewed patient's vital signs, intake and output, medications and labs.  General: alert and cooperative Resp: clear to auscultation bilaterally Cardio: regular rate and rhythm, S1, S2 normal, no murmur, click, rub or gallop GI: soft, non-tender; bowel sounds normal; no masses,  no organomegaly and incision: clean, dry and intact Extremities: extremities normal, atraumatic, no cyanosis or edema Vaginal Bleeding: none  Assessment: s/p Procedure(s): TOTAL ABDOMINAL HYSTERECTOMY WITH BILATERAL  SALPINGO OOPHORECTOMY,  (Bilateral) CYSTOSCOPY (N/A): stable and progressing well  Plan: Advance diet Encourage ambulation Advance to PO medication Discontinue IV fluids  LOS: 1 day    Hale Bogus SUZANNE 01/13/2015, 7:19 AM

## 2015-01-14 ENCOUNTER — Telehealth: Payer: Self-pay | Admitting: Obstetrics & Gynecology

## 2015-01-14 MED ORDER — IBUPROFEN 800 MG PO TABS: 400.0000 mg | ORAL_TABLET | Freq: Three times a day (TID) | ORAL | Status: DC | PRN

## 2015-01-14 MED ORDER — OXYCODONE-ACETAMINOPHEN 5-325 MG PO TABS: 1.0000 | ORAL_TABLET | ORAL | Status: DC | PRN

## 2015-01-14 NOTE — Telephone Encounter (Signed)
Diane from the [employer] for [employer] calling to get confirmation that patient did have surgery on the 18th.

## 2015-01-14 NOTE — Progress Notes (Signed)
Patient vital signs stable. Patient discharged home via ambulation with husband. Discharge instructions reviewed with patient and husband. Patient verbalizes understanding.

## 2015-01-14 NOTE — Telephone Encounter (Signed)
Patient has signed release for information given to [employer]. Returned call to Diane who was not available at this time at [phone]. Spoke with representative Denice Paradise and gave Denice Paradise information that patient did have surgery as scheduled on 01/12/15. She states they will call back as needed for any additional information.   Routing to provider for final review. Patient agreeable to disposition. Will close encounter

## 2015-01-14 NOTE — Progress Notes (Signed)
2 Days Post-Op Procedure(s) (LRB): TOTAL ABDOMINAL HYSTERECTOMY WITH BILATERAL  SALPINGO OOPHORECTOMY,  (Bilateral) CYSTOSCOPY (N/A)  Subjective: Patient reports she had some pain this morning but had gone several hours without any pain medications.  No nausea.  Ambulating well.  Voiding well.  Objective: I have reviewed patient's vital signs, intake and output, medications and labs.  General: alert and cooperative Resp: clear to auscultation bilaterally Cardio: regular rate and rhythm, S1, S2 normal, no murmur, click, rub or gallop GI: soft, non-tender; bowel sounds normal; no masses,  no organomegaly and incision: clean, dry and intact Extremities: extremities normal, atraumatic, no cyanosis or edema Vaginal Bleeding: none  Assessment: s/p Procedure(s): TOTAL ABDOMINAL HYSTERECTOMY WITH BILATERAL  SALPINGO OOPHORECTOMY,  (Bilateral) CYSTOSCOPY (N/A): stable and progressing well  Plan: Discharge home  LOS: 2 days    Hale Bogus SUZANNE 01/14/2015, 9:57 AM

## 2015-01-14 NOTE — Discharge Summary (Signed)
Physician Discharge Summary  Patient ID: Tracie Conway MRN: [id number] DOB/AGE: April 26, 1971 44 y.o.  Admit date: 01/12/2015 Discharge date: 01/14/2015  Admission Diagnoses: 8cm endometrioma, pelvic pain, uterine fibroids, menorrhagia  Discharge Diagnoses:  Active Problems:   Endometriosis   Pelvic pain in female   Discharged Condition: stable  Hospital Course: Patient admitted through same day surgery.  She was taken to OR where TAH/BSO/LOA/Cystoscopy were performed.  Surgical findings included pelvic adhesions, 8cm left endometrioma, uterine fibroids.  Surgery was uneventful.  EBL 150cc.  Foley catheter was left in place.  Patient transferred to PACU where she was stable and then to 3rd floor for the remainder of her hospitalization.  During her post-op recovery, her vitals and stable and she was AF.  In evening of POD#0, she was able to transition to regular diet.  She was able to ambulate and she had good pain control.  In the AM of POD#1, she was without complaint.  Post op hb was 12.0.  Patient seen both in the evening of POD#0 and AM/PM of POD#1, and Am of POD#2.  In the morning of POD#2 and she was without complaints.  At this point, patient was voiding, walking, having excellent pain control, had no nausea, and minimal vaginal bleeding.  She was ready for D/C.  Consults: None  Significant Diagnostic Studies: labs: post op hb 12.0  Treatments: surgery: TAH/BSO/LOA/cystoscopy  Discharge Exam: Blood pressure 133/87, pulse 75, temperature 98.3 F (36.8 C), temperature source Oral, resp. rate 16, height 5\' 4"  (1.626 m), weight 183 lb (83.008 kg), SpO2 98 %. General appearance: alert and cooperative Resp: clear to auscultation bilaterally Cardio: regular rate and rhythm, S1, S2 normal, no murmur, click, rub or gallop GI: soft, non-tender; bowel sounds normal; no masses,  no organomegaly Incision/Wound:c/d/i  Disposition: Final discharge disposition not confirmed     Medication  List    STOP taking these medications        acetaminophen 500 MG tablet  Commonly known as:  TYLENOL     METAMUCIL PO      TAKE these medications        fexofenadine 180 MG tablet  Commonly known as:  ALLEGRA  Take 180 mg by mouth daily.     ibuprofen 800 MG tablet  Commonly known as:  ADVIL,MOTRIN  Take 0.5 tablets (400 mg total) by mouth every 8 (eight) hours as needed.     lisinopril-hydrochlorothiazide 10-12.5 MG per tablet  Commonly known as:  PRINZIDE,ZESTORETIC  Take 1 tablet by mouth daily.     oxyCODONE-acetaminophen 5-325 MG per tablet  Commonly known as:  PERCOCET  Take 1-2 tablets by mouth every 4 (four) hours as needed. use only as much as needed to relieve pain           Follow-up Information    Follow up with Lyman Speller, MD On 01/19/2015.   Specialty:  Gynecology   Why:  appt 2:30pm   Contact information:   [address] [place] [address] [phone]       Signed: Lyman Speller 01/14/2015, 10:13 AM

## 2015-01-14 NOTE — Discharge Instructions (Signed)

## 2015-01-19 ENCOUNTER — Encounter: Payer: Self-pay | Admitting: Obstetrics & Gynecology

## 2015-01-19 ENCOUNTER — Ambulatory Visit (INDEPENDENT_AMBULATORY_CARE_PROVIDER_SITE_OTHER): Payer: BLUE CROSS/BLUE SHIELD | Admitting: Obstetrics & Gynecology

## 2015-01-19 VITALS — BP 124/90 | HR 82 | Ht 64.0 in | Wt 180.0 lb

## 2015-01-19 DIAGNOSIS — Z9889 Other specified postprocedural states: Secondary | ICD-10-CM

## 2015-01-19 MED ORDER — TEMAZEPAM 15 MG PO CAPS: 15.0000 mg | ORAL_CAPSULE | Freq: Every evening | ORAL | Status: DC | PRN

## 2015-01-19 NOTE — Progress Notes (Signed)
Post Operative Visit  Procedure:TAH/BSO/cystoscopy/LOA Days Post-op: 7  Subjective: Doing well.  Having a little trouble sleeping.  Was with family in [place]  for father's funeral.  Emptying bladder fine.  Still with some constipation.  Having daily BM.  No fever.  No vaginal bleeding.  Objective: BP 124/90 mmHg  Pulse 82  Ht 5\' 4"  (1.626 m)  Wt 180 lb (81.647 kg)  BMI 30.88 kg/m2  EXAM General: alert and cooperative GI: soft, non-tender; bowel sounds normal; no masses,  no organomegaly and incision: clean, dry and intact Extremities: extremities normal, atraumatic, no cyanosis or edema Vaginal Bleeding: none  Assessment: s/p TAH/BSO/cystoscopy/LOA  Plan: Recheck 3 weeks

## 2015-02-09 ENCOUNTER — Ambulatory Visit (INDEPENDENT_AMBULATORY_CARE_PROVIDER_SITE_OTHER): Payer: BLUE CROSS/BLUE SHIELD | Admitting: Obstetrics & Gynecology

## 2015-02-09 ENCOUNTER — Encounter: Payer: Self-pay | Admitting: Obstetrics & Gynecology

## 2015-02-09 VITALS — BP 122/90 | HR 86 | Resp 18 | Ht 64.0 in | Wt 183.2 lb

## 2015-02-09 DIAGNOSIS — Z9889 Other specified postprocedural states: Secondary | ICD-10-CM

## 2015-02-09 MED ORDER — ESTRADIOL-NORETHINDRONE ACET 1-0.5 MG PO TABS: 1.0000 | ORAL_TABLET | Freq: Every day | ORAL | Status: DC

## 2015-02-09 NOTE — Progress Notes (Signed)
Post Operative Visit  Procedure: TAH/BSO Date of surgery: 01/12/15  Subjective: Doing well.  Reports she is having hot flashes.  Didn't call as she wanted to see if this was just post surgical.  She is having some sleep disturbance.  Not on any narcotic.  Taking an occasional motrin.  Pre-operative pain that she was having has completely resolved.  Bowel movements are back to normal.  Denies urinary symptoms.    Objective: BP 122/90 mmHg  Pulse 86  Resp 18  Ht 5\' 4"  (1.626 m)  Wt 183 lb 3.2 oz (83.099 kg)  BMI 31.43 kg/m2  LMP 11/27/2014  EXAM General: alert and cooperative Resp: clear to auscultation bilaterally Cardio: regular rate and rhythm, S1, S2 normal, no murmur, click, rub or gallop GI: soft, non-tender; bowel sounds normal; no masses,  no organomegaly and incision: clean, dry and intact Extremities: extremities normal, atraumatic, no cyanosis or edema Vaginal Bleeding: none  Assessment: s/p TAH/BSO  Plan: Recheck 1 year Start Activella 1.0/0.5mg  daily.  Rx #30/2RF.  Pt will give update in three weeks.   D/W pt do's and don'ts at this point and going forward.

## 2015-02-16 ENCOUNTER — Ambulatory Visit (INDEPENDENT_AMBULATORY_CARE_PROVIDER_SITE_OTHER): Payer: BLUE CROSS/BLUE SHIELD | Admitting: Obstetrics & Gynecology

## 2015-02-16 ENCOUNTER — Telehealth: Payer: Self-pay | Admitting: Obstetrics & Gynecology

## 2015-02-16 VITALS — BP 112/66 | HR 64 | Temp 98.8°F | Resp 18 | Ht 64.0 in | Wt 185.0 lb

## 2015-02-16 DIAGNOSIS — N9489 Other specified conditions associated with female genital organs and menstrual cycle: Secondary | ICD-10-CM

## 2015-02-16 DIAGNOSIS — N949 Unspecified condition associated with female genital organs and menstrual cycle: Secondary | ICD-10-CM

## 2015-02-16 DIAGNOSIS — N939 Abnormal uterine and vaginal bleeding, unspecified: Secondary | ICD-10-CM

## 2015-02-16 LAB — POCT URINALYSIS DIPSTICK
Bilirubin, UA: NEGATIVE
Glucose, UA: NEGATIVE
KETONES UA: NEGATIVE
Leukocytes, UA: NEGATIVE
Nitrite, UA: NEGATIVE
Protein, UA: NEGATIVE
Urobilinogen, UA: NEGATIVE
pH, UA: 5

## 2015-02-16 NOTE — Progress Notes (Signed)
44 y.o. Married Caucasian female G2P2 here with complaint of vaginal bleeding after sexual activity this am. Patient had TAH with BSO 01/12/15 and had post op visit on 02/09/15 and been released per patient to have sexual activity.  Denies vaginal irritation prior to sexual activity. Used lubrication without problems. Patient did admit to having had stimulation from spouse in vagina also. Patient experienced burning after sexual activity, no pain and spouse had bleeding on his penis also. She describes bleeding as very small amount of red blood and which decreased after wiping to just brown in color. No urinary issues. Complaining of no pain now or blood on pad.   O: Healthy WD,WN female Affect: appropriate, orientation x 3 Skin:warm and dry Abdomen:soft, non tender, incision appears healed Pelvic exam:EXTERNAL GENITALIA: normal appearing vulva with no masses, tenderness or lesions BUS negative VAGINA: no blood noted at introitus area. Gentle speculum performed with no bleeding noted on right, cuff intact across posterior area of vagina, small area of pink bleeding noted on left vaginal wall near cuff area. Cuff appears intact and healing. Small amount of Monsel's applied to area and rechecked and no other area of bleeding noted. Did not do manual vaginal exam.  A: 5 week post op TAH, BSO for endometritrosis with vaginal bleeding from hand stimulation with sexual activity. Cuff intact. Abdominal incision appears healed.   P: Discussed findings of small area in vagina of bleeding source. Discussed no sexual activity until completed healed and follow instructions given by Dr. Sabra Heck. Patient voiced understanding. Discussed risk of incision tearing or not healing. She related she thought she had been released for sexual activity and will not have sexual activity now. Warnings of vaginal bleeding or pain given and need to advise.   Rv prn

## 2015-02-16 NOTE — Telephone Encounter (Signed)
Spoke with patient. She states she had intercourse this morning and since intercourse has had vaginal bleeding, vaginal pain and "feels burning". Describes burning as all the time, not just with urination.  Notes bleeding on tissue with wiping.  Patient had TAH with BSO  by Dr. Sabra Heck on 01/12/15 and had post op. She states this is not first time of intercourse since post op. Patient expresses concerns and wishes to be seen. Advised Dr. Sabra Heck not in office, can schedule appointment with CNM and can have evaluation. Patient agreeable. Scheduled for today at 1245 for office visit for evaluation.   Routing to provider for final review. Patient agreeable to disposition. Will close encounter.     cc Dr. Sabra Heck.

## 2015-02-16 NOTE — Telephone Encounter (Signed)
Patient has some questions and concerns she would like to speak with nurse about.

## 2015-02-17 ENCOUNTER — Encounter: Payer: Self-pay | Admitting: Obstetrics & Gynecology

## 2015-02-17 NOTE — Progress Notes (Signed)
Pt also seen.  Very strictly advised no additional sexual activity until I have seen her again.  Pt will return in two additional weeks.  Advised of risks of cuff injury including dehiscence with vaginal penetration and possible need to return to OR as well as significant delay in recovery.  Pt states she was doing so well she just didn't think "it would hurt anything".  Advised otherwise.  Pt voices clear understanding.  Felipa Emory, MD.

## 2015-03-02 ENCOUNTER — Encounter: Payer: Self-pay | Admitting: Obstetrics & Gynecology

## 2015-03-02 ENCOUNTER — Ambulatory Visit (INDEPENDENT_AMBULATORY_CARE_PROVIDER_SITE_OTHER): Payer: BLUE CROSS/BLUE SHIELD | Admitting: Obstetrics & Gynecology

## 2015-03-02 VITALS — BP 120/84 | HR 64 | Temp 97.3°F | Resp 18 | Ht 64.0 in | Wt 186.0 lb

## 2015-03-02 DIAGNOSIS — Z9889 Other specified postprocedural states: Secondary | ICD-10-CM

## 2015-03-09 ENCOUNTER — Other Ambulatory Visit: Payer: BLUE CROSS/BLUE SHIELD

## 2015-03-11 NOTE — Progress Notes (Signed)
Post Operative Visit  Procedure:TAH/BSO Days Post-op: 6+ weeks  Subjective: Pt doing well.  Has not had any additional bleeding.  Also hasn't had sexual intercourse again.  Voiding fine.  Feels good on HRT.  Now bowel issues.  Objective: BP 120/84 mmHg  Pulse 64  Temp(Src) 97.3 F (36.3 C) (Oral)  Resp 18  Ht 5\' 4"  (1.626 m)  Wt 186 lb (84.369 kg)  BMI 31.91 kg/m2  LMP 11/27/2014  EXAM General: alert and cooperative Resp: clear to auscultation bilaterally Cardio: regular rate and rhythm, S1, S2 normal, no murmur, click, rub or gallop GI: soft, non-tender; bowel sounds normal; no masses,  no organomegaly and incision: clean, dry and intact Extremities: extremities normal, atraumatic, no cyanosis or edema Vaginal Bleeding: none GYN:  Cuff healing well.  No bleeding.  No discharge  Assessment: s/p TAH/BSO due to extensive endometriosis and endometrioms  Plan: Released to return to normal activity and intercourse Recheck 1 year for AEX Pt knows to call if feels has any change with hormones

## 2015-03-26 ENCOUNTER — Telehealth: Payer: Self-pay | Admitting: Obstetrics & Gynecology

## 2015-03-26 NOTE — Telephone Encounter (Signed)
This appointment request came through [place]. Please advise?  Appointment Request From: Tracie Conway       With Provider: Lyman Speller, MD [facility]]      Preferred Date Range: From 03/26/2015 To 04/03/2015      Preferred Times: Any      Reason for visit: Office Visit      Comments:   I am having bladder and weight issues since surgery

## 2015-03-26 NOTE — Telephone Encounter (Signed)
Spoke with patient regarding appointment request below. Patient had total abdominal hysterectomy on 01/12/2015 with Dr.Miller. States over the last 2 weeks she has begun to experience increased urinary urgency. Has increased fluid in take but feels this is not the case. "I knew I would have to go more. It is more the having to go at the exact minute that is the problem." Denies any current discomfort with urination, pain, fevers, or chills. Advised will need to be seen in the office with Dr.Miller for further evaluation. Patient is agreeable. Offered appointment this afternoon but patient declines. Appointment scheduled for tomorrow at 10:15am with Dr.Miller. Agreeable to date and time.  Routing to provider for final review. Patient agreeable to disposition. Will close encounter.   Patient aware provider will review message and nurse will return call if any additional advice or change of disposition.

## 2015-03-27 ENCOUNTER — Ambulatory Visit (INDEPENDENT_AMBULATORY_CARE_PROVIDER_SITE_OTHER): Payer: BLUE CROSS/BLUE SHIELD | Admitting: Obstetrics & Gynecology

## 2015-03-27 VITALS — BP 130/88 | HR 60 | Temp 97.5°F | Resp 18 | Ht 64.0 in | Wt 190.0 lb

## 2015-03-27 DIAGNOSIS — R350 Frequency of micturition: Secondary | ICD-10-CM

## 2015-03-27 DIAGNOSIS — R6882 Decreased libido: Secondary | ICD-10-CM

## 2015-03-27 LAB — POCT URINALYSIS DIPSTICK
Bilirubin, UA: NEGATIVE
GLUCOSE UA: NEGATIVE
Ketones, UA: NEGATIVE
Leukocytes, UA: NEGATIVE
Nitrite, UA: NEGATIVE
PROTEIN UA: NEGATIVE
RBC UA: NEGATIVE
UROBILINOGEN UA: NEGATIVE
pH, UA: 6

## 2015-03-27 MED ORDER — SOLIFENACIN SUCCINATE 5 MG PO TABS: 5.0000 mg | ORAL_TABLET | Freq: Every day | ORAL | Status: DC

## 2015-03-27 NOTE — Progress Notes (Signed)
Subjective:     Patient ID: Tracie Conway, female   DOB: [birth year], 44 y.o.   MRN: [id number]  HPI 44 yo G2P2 MWF here for discussion of increased urinary urgency over the past two weeks.  No dysuria.  Urinating well--stream starts easily and she feel she does empty her bladder later.  Pt has noted this more when shopping.  Denies vaginal bleeding.  No pain with intercourse.  Pt would like to consider something due to change in libido.  Feels it has just gone away since surgery.  D/w pt use of testosterone due to age and surgical menopause.  Risks of increased acne and hair growth discussed.  Pt aware will monitor blood value in about 12 weeks to ensure level is appropriate.  Also aware this is an "off label" use of testosterone.  Pt very interested in trying this.    Finally, she would like to consider "something" for weight loss.  Dietary change, nutritionist consultation, and weight bearing exercise all discussed.  Pt aware without these, any weight loss medication is likely to be unsuccessful.  Pt will considering options.  Review of Systems  All other systems reviewed and are negative.      Objective:   Physical Exam  Constitutional: She is oriented to person, place, and time. She appears well-developed and well-nourished.  Abdominal: Soft. Bowel sounds are normal.  Genitourinary: Vagina normal. There is no rash, tenderness or lesion on the right labia. There is no rash, tenderness or lesion on the left labia.  Cuff well healed.  No bladder prolapse.  Lymphadenopathy:       Right: No inguinal adenopathy present.       Left: No inguinal adenopathy present.  Neurological: She is alert and oriented to person, place, and time.  Skin: Skin is warm and dry.  Psychiatric: She has a normal mood and affect.   Of note, pt cannot perform a kegel upon request.  She "feels" like she is doing one but no change in muscle tone noted.    Assessment:     Urinary urgency Decreased libido      Plan:     Vesicare 5mg  daily.   Referral to Ileana Roup at [facility] for pelvic strengthening.   Adding testosterone 2% cream topically two to three times weekly, 1/4 tsp.  Rx will be faxed to [place].  Check total testosterone level 12 weeks.

## 2015-03-29 LAB — URINE CULTURE
Colony Count: NO GROWTH
ORGANISM ID, BACTERIA: NO GROWTH

## 2015-03-31 ENCOUNTER — Telehealth: Payer: Self-pay

## 2015-03-31 NOTE — Telephone Encounter (Signed)
-----   Message from Megan Salon, MD sent at 03/31/2015  1:26 AM EDT ----- Please inform urine culture was negative.  She should have started on Vesicare already.  She knows to call with any side effects but I would like to know if she thinks this has helped any?  Plan follow-up three months.  This will give her time to see Hart Robinsons a few times as well.  Thanks.

## 2015-03-31 NOTE — Telephone Encounter (Signed)
Lmtcb//kn 

## 2015-04-02 ENCOUNTER — Encounter: Payer: Self-pay | Admitting: Obstetrics & Gynecology

## 2015-04-06 NOTE — Telephone Encounter (Signed)
Patient notified of all results-see result note.//kn

## 2015-04-08 ENCOUNTER — Telehealth: Payer: Self-pay | Admitting: Obstetrics & Gynecology

## 2015-04-08 MED ORDER — TOLTERODINE TARTRATE ER 4 MG PO CP24: 4.0000 mg | ORAL_CAPSULE | Freq: Every day | ORAL | Status: DC

## 2015-04-08 NOTE — Telephone Encounter (Signed)
Notes Recorded by Jasmine Awe, RN on 04/03/2015 at 5:40 PM Per review of formulary. Appears Detrol LA, Ditropan XL, and Enablex will be the most cost effective for patient. Notes Recorded by Megan Salon, MD on 04/02/2015 at 9:10 AM Verline Lema, can you please see if can access her formulary and see what is cheaper in the anticholinergic group of medications Notes Recorded by Robley Fries, CMA on 03/31/2015 at 9:04 AM Patient notified of results. States had discussed with Dr Sabra Heck, if Vesicare was a higher copay, she could prescribe something else. She did not pick up Rx because it was $90. Aware Dr Sabra Heck is out of the office until Thursday, will call her back then with new Rx. Please advise. Appointment for 3 month follow up scheduled on 07/09/15.//kn Notes Recorded by Robley Fries, CMA on 03/31/2015 at 8:50 AM Left message to return call. Notes Recorded by Megan Salon, MD on 03/31/2015 at 1:26 AM Please inform urine culture was negative. She should have started on Vesicare already. She knows to call with any side effects but I would like to know if she thinks this has helped any? Plan follow-up three months. This will give her time to see Hart Robinsons a few times as well. Thanks.

## 2015-04-08 NOTE — Telephone Encounter (Signed)
Spoke with patient. Patient is agreeable to rx change. Rx for Detrol LA 4mg  #30 0RF sent to pharmacy on file. Patient will call if rx is too costly. Patient has previous scheduled medication follow up with Dr.Miller for 07/09/2015 at 8:30am.   Cc: Dr.Miller  Routing to provider for final review. Patient agreeable to disposition. Will close encounter.   Patient aware provider will review message and nurse will return call if any additional advice or change of disposition.

## 2015-04-08 NOTE — Telephone Encounter (Signed)
Routing to Dr.Silva for review of OAB medication alternatives as listed below for patient.

## 2015-04-08 NOTE — Telephone Encounter (Signed)
Patient is calling regarding message she left on last week about a medication change.

## 2015-04-08 NOTE — Telephone Encounter (Signed)
OK for Detrol LA 4 mg daily.  #30, RF one.  Please send to her pharmacy of choice. Recheck with Dr. Sabra Heck in 6 weeks.

## 2015-05-03 ENCOUNTER — Other Ambulatory Visit: Payer: Self-pay | Admitting: Obstetrics and Gynecology

## 2015-05-04 NOTE — Telephone Encounter (Signed)
Medication refill request: Detrol LA Last AEX:  Patient has not had a AEX. Last OV was 03/27/15 with SM Next AEX: 05/26/16 with SM Last MMG (if hormonal medication request):  Refill authorized: please advise.

## 2015-05-06 ENCOUNTER — Other Ambulatory Visit: Payer: Self-pay | Admitting: Obstetrics & Gynecology

## 2015-05-07 NOTE — Telephone Encounter (Signed)
Medication refill request: Mimvey  Last AEX:  12/23/14 with SM Next AEX: 05/26/16 with SM Last MMG (if hormonal medication request): 07/04/2011 bi-rads 1: negative Refill authorized:   3 month recheck scheduled for 07/09/15 with Dr. Sabra Heck

## 2015-07-09 ENCOUNTER — Other Ambulatory Visit: Payer: Self-pay

## 2015-07-09 ENCOUNTER — Encounter: Payer: Self-pay | Admitting: Obstetrics & Gynecology

## 2015-07-09 ENCOUNTER — Ambulatory Visit (INDEPENDENT_AMBULATORY_CARE_PROVIDER_SITE_OTHER): Payer: BLUE CROSS/BLUE SHIELD | Admitting: Obstetrics & Gynecology

## 2015-07-09 VITALS — BP 120/80 | HR 68 | Resp 16 | Wt 203.0 lb

## 2015-07-09 DIAGNOSIS — N3281 Overactive bladder: Secondary | ICD-10-CM | POA: Diagnosis not present

## 2015-07-09 DIAGNOSIS — F520 Hypoactive sexual desire disorder: Secondary | ICD-10-CM | POA: Diagnosis not present

## 2015-07-09 DIAGNOSIS — E669 Obesity, unspecified: Secondary | ICD-10-CM

## 2015-07-09 DIAGNOSIS — Z1231 Encounter for screening mammogram for malignant neoplasm of breast: Secondary | ICD-10-CM

## 2015-07-09 MED ORDER — MEDROXYPROGESTERONE ACETATE 2.5 MG PO TABS: 2.5000 mg | ORAL_TABLET | Freq: Every day | ORAL | Status: DC

## 2015-07-09 MED ORDER — EST ESTROGENS-METHYLTEST 0.625-1.25 MG PO TABS: 1.0000 | ORAL_TABLET | Freq: Every day | ORAL | Status: DC

## 2015-07-09 MED ORDER — PHENTERMINE HCL 15 MG PO CAPS: 15.0000 mg | ORAL_CAPSULE | ORAL | Status: DC

## 2015-07-09 NOTE — Progress Notes (Signed)
Patient ID: EDDY TERMINE, female   DOB: October 18, 1970, 44 y.o.   MRN: [id number]   44 yo G2P2 MWF here for follow up after starting Detrol LA.  Pt really feels like this has helped.  She missed it for a weekend and on the following Monday, realized just how much it was helping.  She is not having any side effects.  Declines dry eyes, dry mouth or constipation.  Would like to continue.  Pt never tried the topical testosterone, although she got it filled.  Never seemed to be able to start it with any routine.  Just doesn't think a topical solution is going to work for her.  Pt would still like to consider.  Risks and benefits reviewed.  Will change HRT to include.  Pt aware will need two medications--estratest and provera.  Understands testosterone use is off label.  Lastly, pt would like assistance with getting weight under control.  Feels weight gain has come since surgery.  We have discussed phentermine in the past and agree with trial.  Risks and benefits reviewed including risks of headache, insomnia, pulmonary hypertension, stroke, elevated BP.  Voices understanding.  Diet and exercise important.  Exercise 147min/week encouraged.  Pt will start keeping a food and exercise journal and will follow up in six weeks if feel is helping.  Assessment:  OAB, Hypoactive sexual desire d/o Obesity (BMI 34) and weight gain since hysterectomy  Plan:  Change HRT to estratest HS 0.625/1.25mg  and Provera 2.5mg  daily.  Rx to pharmacy. Start phentermine 15mg  daily.  #30/1RF.  Follow up six weeks  ~20 minutes spent with patient >50% of time was in face to face discussion of above.

## 2015-07-17 ENCOUNTER — Encounter: Payer: Self-pay | Admitting: Obstetrics & Gynecology

## 2015-07-17 ENCOUNTER — Telehealth: Payer: Self-pay

## 2015-07-17 NOTE — Telephone Encounter (Signed)
Visit Follow-Up Question  Message [id number]   From  Pike, MD   Sent  07/17/2015 8:44 AM     Good Morning,  At my appointment on 07/09/15, Dr Sabra Heck wrote a prescription for an estrogen/testosterone combination. My copay for that prescription is $137. That is more than I want to pay monthly. Is there another option? If not, I can stay with my current prescription and use the testosterone cream I have.   Thanks      Responsible Party    Pool - [facility] Pool No one has taken responsibility for this message.     No actions have been taken on this message.

## 2015-07-17 NOTE — Telephone Encounter (Signed)
Telephone encounter created for review of mychart message with Dr.Miller.

## 2015-07-17 NOTE — Telephone Encounter (Signed)
Patient was seen on 07/09/2015 with Dr.Miller. Was changed to Estratest HS 0.625/1.25 mg and Provera 2.5 mg. Patient states that the copay for the Estratest will be $137. Patient is unable to afford this monthly. Requesting an alternative or would like to return to taking Activella and start using the Testosterone cream. Routing to Dr.Miller for review and advise.

## 2015-07-18 ENCOUNTER — Other Ambulatory Visit: Payer: Self-pay | Admitting: Obstetrics & Gynecology

## 2015-07-18 MED ORDER — ESTRADIOL-NORETHINDRONE ACET 1-0.5 MG PO TABS: 1.0000 | ORAL_TABLET | Freq: Every day | ORAL | Status: DC

## 2015-07-18 NOTE — Telephone Encounter (Signed)
There is no other alternative.  Rx for Activella redone.  Will do the rx for testosterone next week as it needs to be faxed.  She needs a testosterone level done in 12 weeks.

## 2015-07-20 NOTE — Telephone Encounter (Signed)
Routing to [place] for Testosterone rx prior to calling patient.

## 2015-07-21 MED ORDER — NONFORMULARY OR COMPOUNDED ITEM: Status: DC

## 2015-07-21 NOTE — Telephone Encounter (Signed)
Rx done and on your desk [facility].  Thanks.

## 2015-07-22 ENCOUNTER — Telehealth: Payer: Self-pay | Admitting: Obstetrics & Gynecology

## 2015-07-22 NOTE — Telephone Encounter (Signed)
Spoke with patient. Advised of message as seen below from [place]. Patient is agreeable. [facility]s [facility] [address] [facility]. Advised I will fax this rx to her pharmacy of choice. Patient is agreeable. [phone] with [facility] sheet and confirmation. 6 week follow up appointment moved to 12 week follow up. Scheduled for 10/16/2015 at 2:30 pm with Dr.Miller. Patient is agreeable to date and time.  Routing to provider for final review. Patient agreeable to disposition. Will close encounter.

## 2015-07-22 NOTE — Telephone Encounter (Signed)
Spoke with patient. Advised of message as seen below [facility]. Patient is agreeable for Rx to go to [facility] for compounding. Rx faxed to [place] at [phone] with cover sheet and confirmation.  Routing to provider for final review. Patient agreeable to disposition. Will close encounter.

## 2015-07-22 NOTE — Telephone Encounter (Signed)
Pharmacy calling because they do not carry the drug (testosterone). The patient will have to get this filled at another pharmacy that does compounds.

## 2015-07-24 ENCOUNTER — Ambulatory Visit
Admission: RE | Admit: 2015-07-24 | Discharge: 2015-07-24 | Disposition: A | Discharge status: Home / Self Care | Payer: BLUE CROSS/BLUE SHIELD | Source: Ambulatory Visit

## 2015-07-24 DIAGNOSIS — Z1231 Encounter for screening mammogram for malignant neoplasm of breast: Secondary | ICD-10-CM

## 2015-07-28 ENCOUNTER — Other Ambulatory Visit: Payer: Self-pay | Admitting: Family Medicine

## 2015-07-28 DIAGNOSIS — R928 Other abnormal and inconclusive findings on diagnostic imaging of breast: Secondary | ICD-10-CM

## 2015-07-29 ENCOUNTER — Other Ambulatory Visit: Payer: Self-pay | Admitting: Family Medicine

## 2015-07-29 DIAGNOSIS — R928 Other abnormal and inconclusive findings on diagnostic imaging of breast: Secondary | ICD-10-CM

## 2015-08-03 ENCOUNTER — Ambulatory Visit
Admission: RE | Admit: 2015-08-03 | Discharge: 2015-08-03 | Disposition: A | Discharge status: Home / Self Care | Payer: BLUE CROSS/BLUE SHIELD | Source: Ambulatory Visit | Attending: Family Medicine | Admitting: Family Medicine

## 2015-08-03 DIAGNOSIS — R928 Other abnormal and inconclusive findings on diagnostic imaging of breast: Secondary | ICD-10-CM

## 2015-08-24 ENCOUNTER — Other Ambulatory Visit: Payer: Self-pay | Admitting: Obstetrics & Gynecology

## 2015-08-24 NOTE — Telephone Encounter (Signed)
Medication refill request: Phentermine Last Office Visit: 07-09-15 Next AEX: 05-26-16 Last MMG (if hormonal medication request): 08-03-15 WNL Refill authorized: please advise

## 2015-08-28 ENCOUNTER — Ambulatory Visit: Payer: BLUE CROSS/BLUE SHIELD | Admitting: Obstetrics & Gynecology

## 2015-10-16 ENCOUNTER — Other Ambulatory Visit: Payer: Self-pay | Admitting: Obstetrics & Gynecology

## 2015-10-16 ENCOUNTER — Ambulatory Visit (INDEPENDENT_AMBULATORY_CARE_PROVIDER_SITE_OTHER): Payer: BLUE CROSS/BLUE SHIELD | Admitting: Obstetrics & Gynecology

## 2015-10-16 ENCOUNTER — Encounter: Payer: Self-pay | Admitting: Obstetrics & Gynecology

## 2015-10-16 VITALS — BP 126/84 | HR 88 | Resp 16 | Ht 64.0 in | Wt 211.0 lb

## 2015-10-16 DIAGNOSIS — R6882 Decreased libido: Secondary | ICD-10-CM | POA: Diagnosis not present

## 2015-10-16 DIAGNOSIS — N3281 Overactive bladder: Secondary | ICD-10-CM | POA: Diagnosis not present

## 2015-10-16 DIAGNOSIS — R635 Abnormal weight gain: Secondary | ICD-10-CM | POA: Diagnosis not present

## 2015-10-16 MED ORDER — PHENTERMINE HCL 37.5 MG PO CAPS: 37.5000 mg | ORAL_CAPSULE | Freq: Every morning | ORAL | Status: DC

## 2015-10-16 NOTE — Addendum Note (Signed)
Addended by: Megan Salon on: 10/16/2015 03:19 PM   Modules accepted: Orders

## 2015-10-16 NOTE — Progress Notes (Signed)
Patient ID: Tracie Conway, female   DOB: November 12, 1970, 45 y.o.   MRN: [id number]  45 yo G2P2 MWF here for discussion of HRT and hormonal therapy.  On Mimvey/Activella 1.0/0.5mg  dosing.  She is also using topical testosterone.  Feels this is helping some but she's had several issues over the holidays.  She had a shingles outbreak over Thanksgiving.  Also, Christmas was hard due to death of her father last year.  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  Reports right after the new year, there were a lot of layoffs.  Pt thinks there were >150 layoffs in company of about a 1000 people.  She does think the Detrol has helped.  She did go for physical therapy and she states this really helped.  She states she hasn't been doing this as much.    She is frustrated with her weight.  She is up to #211.  Was #178 before her surgery in April of last year.  She is aware of side effects which were reviewed again with pt, including risks of headache, insomnia, pulmonary hypertension, stroke, elevated BP. Voices understanding. Diet and exercise important. Exercise 187min/week encouraged. Pt will start keeping a food and exercise journal and will follow up in six weeks if feel is helping.  Assessment: OAB, Hypoactive sexual desire d/o Obesity (BMI 34) and weight gain since hysterectomy  Plan: Continue Activella 1/0.5 Testosterone level today Continue topical testosterone 3-4 times weekly Change phentermine to 37.5mg  daily.  #30/1RF.   Recheck 2 months.    ~15 minutes spent with patient, all of this was in face to face discussion of above.

## 2015-10-18 LAB — TESTOSTERONE, TOTAL, LC/MS/MS: TESTOSTERONE, TOTAL, LC-MS-MS: 65 ng/dL — AB (ref 2–45)

## 2015-11-26 ENCOUNTER — Telehealth: Payer: Self-pay | Admitting: *Deleted

## 2015-11-26 NOTE — Telephone Encounter (Signed)
Left patient a message to call back. Just wanting to let her know that her insurance will not cover the Phentermine. She will need to pay cash for it.

## 2015-12-17 ENCOUNTER — Ambulatory Visit: Payer: BLUE CROSS/BLUE SHIELD | Admitting: Obstetrics & Gynecology

## 2015-12-22 ENCOUNTER — Ambulatory Visit (INDEPENDENT_AMBULATORY_CARE_PROVIDER_SITE_OTHER): Payer: BLUE CROSS/BLUE SHIELD | Admitting: Obstetrics & Gynecology

## 2015-12-22 ENCOUNTER — Encounter: Payer: Self-pay | Admitting: Obstetrics & Gynecology

## 2015-12-22 VITALS — BP 132/94 | HR 80 | Resp 14 | Wt 206.0 lb

## 2015-12-22 DIAGNOSIS — N3281 Overactive bladder: Secondary | ICD-10-CM

## 2015-12-22 DIAGNOSIS — E669 Obesity, unspecified: Secondary | ICD-10-CM

## 2015-12-22 NOTE — Progress Notes (Signed)
Patient ID: Tracie Conway, female   DOB: May 12, 1971, 45 y.o.   MRN: [id number]  45 yo G2P2 MWF here for follow up after starting phentermine again for weight loss.  She is on the 37.5mg  dosage.  Pt reports she is having dry mouth with the higher dosage.  She hasn't taken her blood pressure medication today but has it in the car.  Weight:  206#.  Last weight was 211#. BP:  132/94.  Work continues to be stressful.  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  Then in February, there were another 77 individuals who lost their jobs.  There are rumors about another layoff at the end of March.    D/w pt that the Detrol and the phentermine together may be contributing to the dry mouth.  She did not have dry mouth before going up on the phentermine dosage.    Denies headache or blurry vision.  She and her husband are exercising.  They now have a home gym.  She is doing walking/running intervals.    Assessment: OAB, Hypoactive sexual desire d/o Obesity (BMI 35) and weight gain since hysterectomy  Plan: Continue Activella 1/0.5 Continue topical testosterone 3-4 times weekly Change phentermine to 37.5mg  daily.  #30/1RF.   Pt will check BP at home.  If remains elevated, she will have to go back to the lower dosage. Recheck 2 months.    ~15 minutes spent with patient, all of this was in face to face discussion of above.

## 2015-12-29 ENCOUNTER — Telehealth: Payer: Self-pay | Admitting: *Deleted

## 2015-12-29 NOTE — Telephone Encounter (Signed)
Left Voicemail for pt to call back. 

## 2015-12-29 NOTE — Telephone Encounter (Signed)
Will you please call and see if she wants to decrease to the lower dosage of phentermine due to significant dry mouth?  I can change rx if she needs.

## 2015-12-29 NOTE — Telephone Encounter (Signed)
Patient states she has been taking her BP med and checked her BP: 3/29 = 132/88 3/30 = 124/85 3/31 = 127/91 4/1 = 120/85 4/2 = 124/85  Advised pt BP reading are mostly in the normal range. Will call pt if Dr. Sabra Heck has any further recommendations.   Dr. Lestine Box Encounter closed.

## 2015-12-29 NOTE — Telephone Encounter (Signed)
-----   Message from Tracie Salon, MD sent at 12/26/2015  7:49 AM EDT ----- Regarding: check on pt's BP Can you call pt and ask her if she checked her BP last week.  Was elevated at OV but she hasn't taken her BP medication that morning.    Thanks.  MSM

## 2015-12-31 NOTE — Telephone Encounter (Signed)
Called patient. she agrees to the lower dose. Pt uses [facility].

## 2016-01-04 MED ORDER — PHENTERMINE HCL 15 MG PO CAPS: 15.0000 mg | ORAL_CAPSULE | Freq: Every morning | ORAL | Status: DC

## 2016-01-04 NOTE — Telephone Encounter (Signed)
[facility] [address] 

## 2016-01-04 NOTE — Addendum Note (Signed)
Addended by: Megan Salon on: 01/04/2016 04:00 PM   Modules accepted: Orders

## 2016-01-04 NOTE — Telephone Encounter (Signed)
New prescription written.  Please send to pharmacy on file.  Thanks.

## 2016-04-05 ENCOUNTER — Other Ambulatory Visit: Payer: Self-pay | Admitting: Obstetrics & Gynecology

## 2016-04-05 NOTE — Telephone Encounter (Signed)
Medication refill request: Tolterodine (DETROL LA)4mg  Last AEX:  Patient here 12/22/15 for OAB Next AEX: 05/26/16 Last MMG (if hormonal medication request): 08/03/15 BIRADS1 negative Refill authorized: 05/04/15 #30 w/11 refills; today #30 w/1 refill?

## 2016-05-26 ENCOUNTER — Ambulatory Visit (INDEPENDENT_AMBULATORY_CARE_PROVIDER_SITE_OTHER): Payer: BLUE CROSS/BLUE SHIELD | Admitting: Obstetrics & Gynecology

## 2016-05-26 ENCOUNTER — Encounter: Payer: Self-pay | Admitting: Obstetrics & Gynecology

## 2016-05-26 VITALS — BP 120/80 | HR 68 | Resp 16 | Ht 64.5 in | Wt 185.0 lb

## 2016-05-26 DIAGNOSIS — N3281 Overactive bladder: Secondary | ICD-10-CM | POA: Diagnosis not present

## 2016-05-26 DIAGNOSIS — Z23 Encounter for immunization: Secondary | ICD-10-CM | POA: Diagnosis not present

## 2016-05-26 DIAGNOSIS — Z0000 Encounter for general adult medical examination without abnormal findings: Secondary | ICD-10-CM | POA: Diagnosis not present

## 2016-05-26 DIAGNOSIS — Z01419 Encounter for gynecological examination (general) (routine) without abnormal findings: Secondary | ICD-10-CM

## 2016-05-26 LAB — POCT URINALYSIS DIPSTICK
Bilirubin, UA: NEGATIVE
Blood, UA: NEGATIVE
Glucose, UA: NEGATIVE
KETONES UA: NEGATIVE
LEUKOCYTES UA: NEGATIVE
NITRITE UA: NEGATIVE
PH UA: 5
PROTEIN UA: NEGATIVE
Urobilinogen, UA: NEGATIVE

## 2016-05-26 MED ORDER — TOLTERODINE TARTRATE ER 4 MG PO CP24: 4.0000 mg | ORAL_CAPSULE | Freq: Every day | ORAL | 4 refills | Status: DC

## 2016-05-26 MED ORDER — ESTRADIOL-NORETHINDRONE ACET 1-0.5 MG PO TABS: 1.0000 | ORAL_TABLET | Freq: Every day | ORAL | 4 refills | Status: AC

## 2016-05-26 MED ORDER — ESTRADIOL-NORETHINDRONE ACET 1-0.5 MG PO TABS: 1.0000 | ORAL_TABLET | Freq: Every day | ORAL | 12 refills | Status: DC

## 2016-05-26 NOTE — Progress Notes (Signed)
45 y.o. G2P2 MarriedCaucasianF here for annual exam.  Doing well.  Still at the same job.  Had additional layoffs at work and she got trough that round of layoffs.  Has continued to work on weight loss.  She is [employer] for weight loss.  Feeling really good.  Protein and vegetables are most of the diet.    No vaginal bleeding.    Has done blood work with Dr. Sabra Heck within the last year.  Patient's last menstrual period was 11/27/2014.          Sexually active: Yes.    The current method of family planning is status post hysterectomy.    Exercising: Yes.    walking & running Smoker:  no  Health Maintenance: Pap:  12-23-14 neg HPV HR neg History of abnormal Pap:  Yes cryo in college MMG:  Bilateral 10/16, rt breast 08-03-15 category c density birads 1:neg Colonoscopy:  none BMD:   none TDaP:  unsure Pneumonia vaccine(s):  no Zostavax:   No but pt had shingles this yr Hep C testing: not done Screening Labs: with Kathyrn Lass, Md, Hb today: 14.4, Urine today: neg Self breast exam: done occ   reports that she has been smoking Cigars.  She has never used smokeless tobacco. She reports that she drinks about 0.6 oz of alcohol per week . She reports that she does not use drugs.  Past Medical History:  Diagnosis Date  . Abnormal Pap smear of cervix    in college-had cryosurgery  . Hypertension     Past Surgical History:  Procedure Laterality Date  . ABDOMINAL HYSTERECTOMY    . CYSTOSCOPY N/A 01/12/2015   Procedure: CYSTOSCOPY;  Surgeon: Megan Salon, MD;  Location: [facility];  Service: Gynecology;  Laterality: N/A;  . DILATION AND CURETTAGE OF UTERUS     Dr. Janese Banks  . GYNECOLOGIC CRYOSURGERY     in college  . ROBOTIC ASSISTED TOTAL HYSTERECTOMY WITH BILATERAL SALPINGO OOPHERECTOMY Bilateral 01/12/2015   Procedure: TOTAL ABDOMINAL HYSTERECTOMY WITH BILATERAL  SALPINGO OOPHORECTOMY, ;  Surgeon: Megan Salon, MD;  Location: [facility];  Service: Gynecology;  Laterality: Bilateral;  .  WISDOM TOOTH EXTRACTION      Current Outpatient Prescriptions  Medication Sig Dispense Refill  . cetirizine (ZYRTEC) 10 MG tablet Take 10 mg by mouth daily.    Marland Kitchen estradiol-norethindrone (ACTIVELLA) 1-0.5 MG tablet Take 1 tablet by mouth daily. 30 tablet 12  . lisinopril-hydrochlorothiazide (PRINZIDE,ZESTORETIC) 10-12.5 MG per tablet Take 1 tablet by mouth daily.   5  . tolterodine (DETROL LA) 4 MG 24 hr capsule TAKE 1 CAPSULE (4 MG TOTAL) BY MOUTH DAILY. 30 capsule 1   No current facility-administered medications for this visit.     Family History  Problem Relation Age of Onset  . Adopted: Yes    ROS:  Pertinent items are noted in HPI.  Otherwise, a comprehensive ROS was negative.  Exam:   BP 120/80   Pulse 68   Resp 16   Ht 5' 4.5" (1.638 m)   Wt 185 lb (83.9 kg)   LMP 11/27/2014   BMI 31.26 kg/m   Weight change: -21#  Height: 5' 4.5" (163.8 cm)  Ht Readings from Last 3 Encounters:  05/26/16 5' 4.5" (1.638 m)  10/16/15 5\' 4"  (1.626 m)  03/27/15 5\' 4"  (1.626 m)    General appearance: alert, cooperative and appears stated age Head: Normocephalic, without obvious abnormality, atraumatic Neck: no adenopathy, supple, symmetrical, trachea midline and thyroid  normal to inspection and palpation Lungs: clear to auscultation bilaterally Breasts: normal appearance, no masses or tenderness Heart: regular rate and rhythm Abdomen: soft, non-tender; bowel sounds normal; no masses,  no organomegaly Extremities: extremities normal, atraumatic, no cyanosis or edema Skin: Skin color, texture, turgor normal. No rashes or lesions Lymph nodes: Cervical, supraclavicular, and axillary nodes normal. No abnormal inguinal nodes palpated Neurologic: Grossly normal   Pelvic: External genitalia:  no lesions              Urethra:  normal appearing urethra with no masses, tenderness or lesions              Bartholins and Skenes: normal                 Vagina: normal appearing vagina with normal  color and discharge, no lesions              Cervix: absent              Pap taken: No. Bimanual Exam:  Uterus:  uterus absent              Adnexa: no mass, fullness, tenderness               Rectovaginal: Confirms               Anus:  normal sphincter tone, no lesions  Chaperone was present for exam.  A:  Well Woman with normal exam H/O TAH/BSO due to endometriosis and endometrioma OAB Hypetension  P:   Mammogram yearly.  Doing this in October pap smear not indicated Activella 0.1/0.5mg  daily #90/4.  Taking generic which is fine. Detrol LA 4mg  daily.  #90/4RF Labs with Dr. Kathyrn Lass Tdap update today Pt and I discussed pt following up in the future with her PCP as she had a complete hysterectomy.  She will decide and is welcome to continue coming or to return for problem visits

## 2016-05-31 LAB — HEMOGLOBIN, FINGERSTICK: Hemoglobin, fingerstick: 14.4 g/dL (ref 12.0–16.0)

## 2016-07-21 ENCOUNTER — Other Ambulatory Visit: Payer: Self-pay | Admitting: Obstetrics & Gynecology

## 2016-08-17 ENCOUNTER — Other Ambulatory Visit: Payer: Self-pay | Admitting: Obstetrics & Gynecology

## 2016-08-24 ENCOUNTER — Other Ambulatory Visit: Payer: Self-pay | Admitting: Obstetrics & Gynecology

## 2016-08-24 NOTE — Telephone Encounter (Signed)
Left message to call back 08/24/16. -sco

## 2016-08-24 NOTE — Telephone Encounter (Signed)
Medication refill request: MIMVEY 1-0.5 Last AEX:  05/26/16 SM Next AEX: none scheduled Last MMG (if hormonal medication request): 08/03/15 BIRADS 1 negative Refill authorized: 05/26/16 #90 w/4 refills; today refused patient has enough refills

## 2016-12-05 IMAGING — MR MR PELVIS WO/W CM
7 of 10 series · 32 of 48 positions shown · IV contrast (multihance)
Comparison: Pelvic ultrasound at [HOSPITAL] at [HOSPITAL]
[HOSPITAL] 11/25/2014 and 10/13/2014, CT abdomen/pelvis
10/08/2014

CLINICAL DATA: Bilateral adnexal masses, left lower abdominal pain
for 3 months

EXAM:
MRI PELVIS WITHOUT AND WITH CONTRAST
TECHNIQUE: Multiplanar multisequence MR imaging of the pelvis was performed
both before and after administration of intravenous contrast.
BUN and creatinine were obtained on site at [HOSPITAL] at
[HOSPITAL].
Results:  BUN 9 mg/dL,  Creatinine 0.8 mg/dL.
CONTRAST:  16mL MULTIHANCE GADOBENATE DIMEGLUMINE 529 MG/ML IV SOLN

[Series 2: cor haste · coronal · 6.0mm · 0.78mm/px · 4 of 23 slices shown]
[im 1/23]
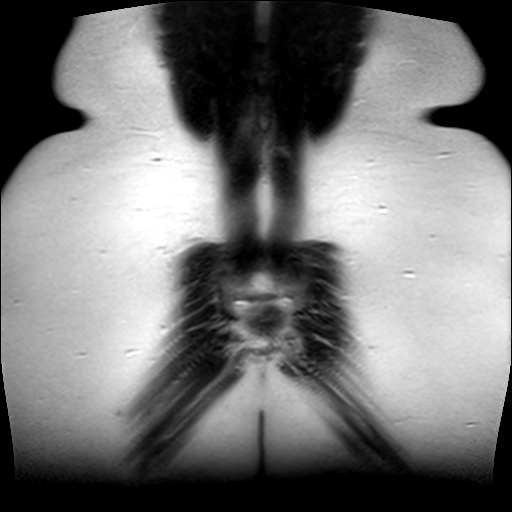
[im 8/23]
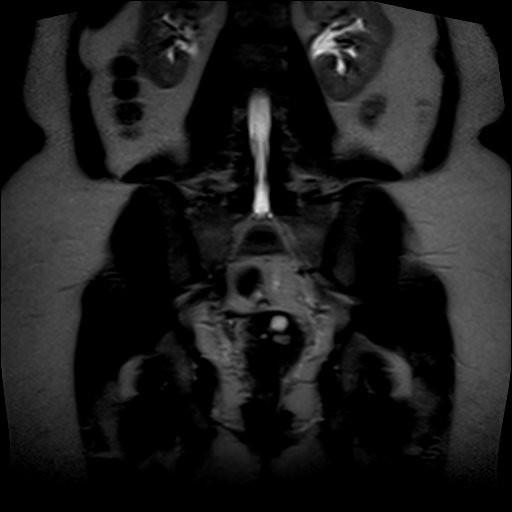
[im 15/23]
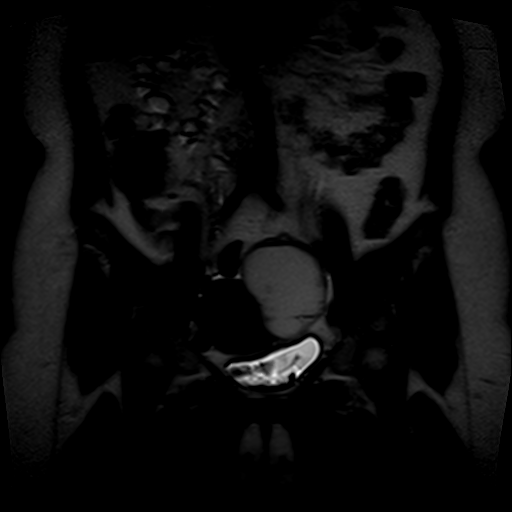
[im 23/23]
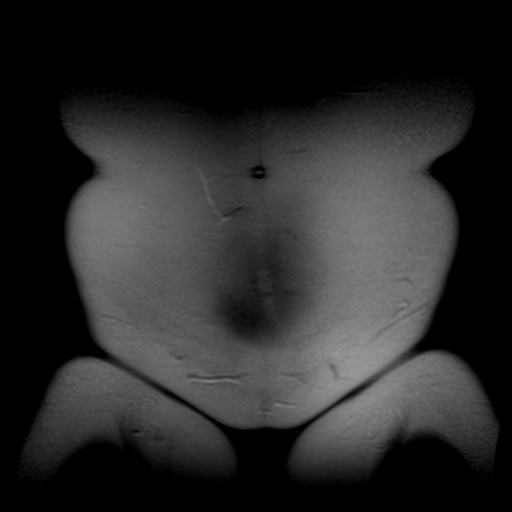

[Series 3: t2_tse_sag · sagittal · 5.0mm · 1.05mm/px · 5 of 27 slices shown]
[im 1/27]
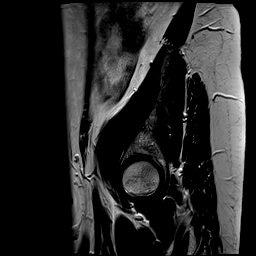
[im 7/27]
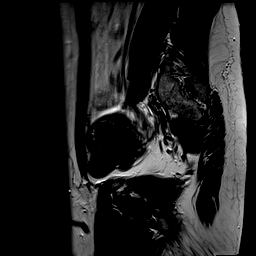
[im 14/27]
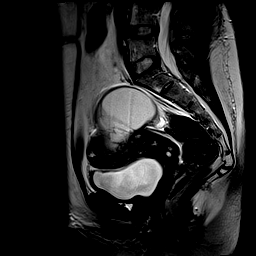
[im 20/27]
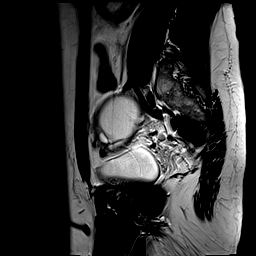
[im 27/27]
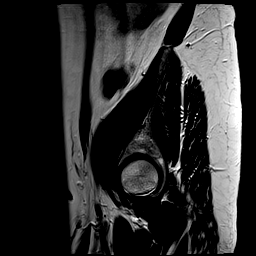

[Series 4: t2_tse axial · axial · 6.0mm · 0.98mm/px · z∈[-101,+79]mm · 5 of 26 slices shown]
[im 1/26]
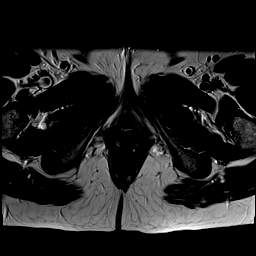
[im 7/26]
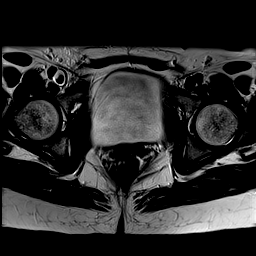
[im 13/26]
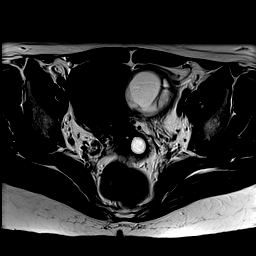
[im 19/26]
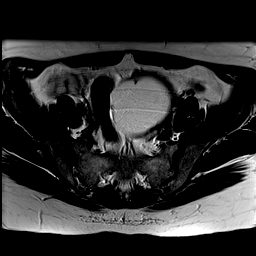
[im 26/26]
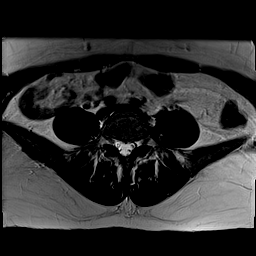

[Series 5: t2_tse axial fs · axial · 6.0mm · 0.98mm/px · z∈[-101,+79]mm · 5 of 26 slices shown]
[im 1/26]
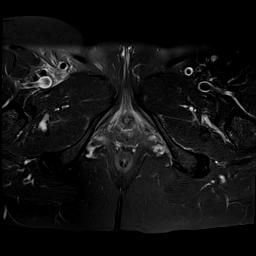
[im 7/26]
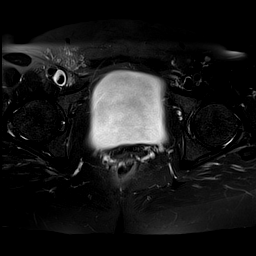
[im 13/26]
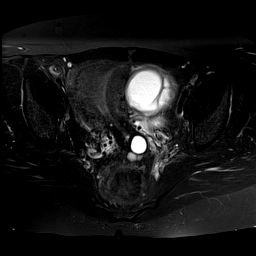
[im 19/26]
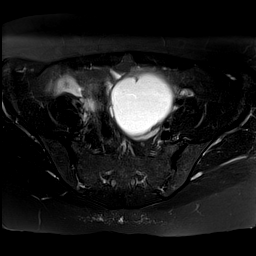
[im 26/26]
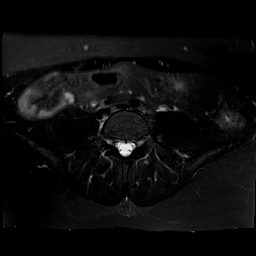

[Series 6: axial spgr · axial · 6.0mm · 0.98mm/px · z∈[-101,+79]mm · 5 of 26 slices shown]
[im 1/26]
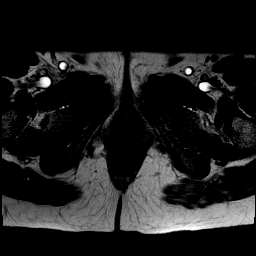
[im 7/26]
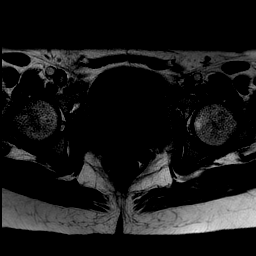
[im 13/26]
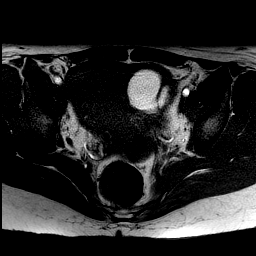
[im 19/26]
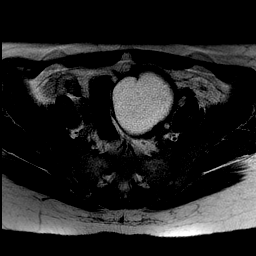
[im 26/26]
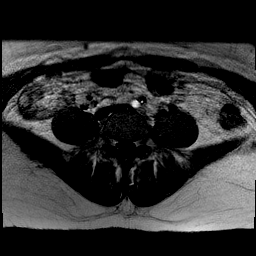

[Series 7: axial spgr pre · axial · non-contrast · 6.0mm · 0.49mm/px · z∈[-101,+79]mm · 5 of 26 slices shown]
[im 1/26]
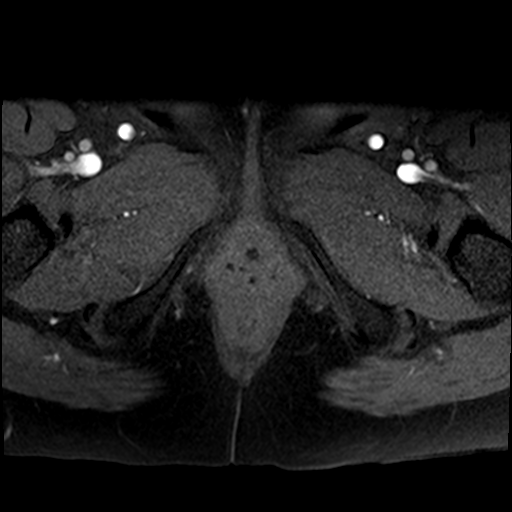
[im 7/26]
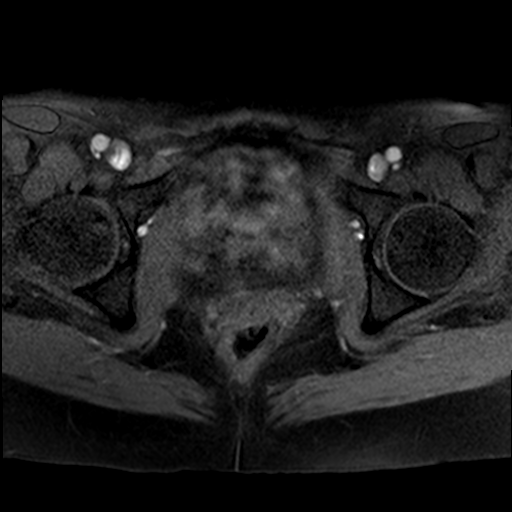
[im 13/26]
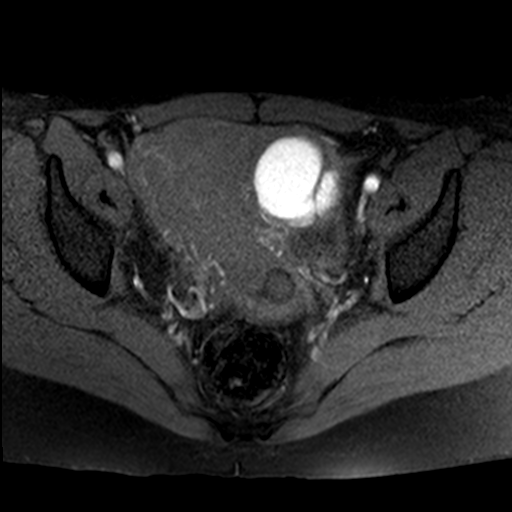
[im 19/26]
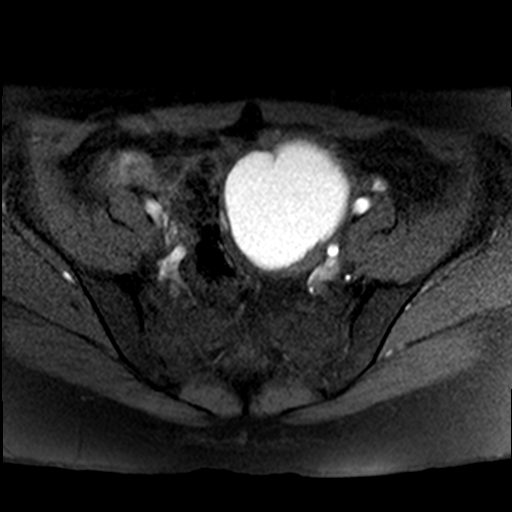
[im 26/26]
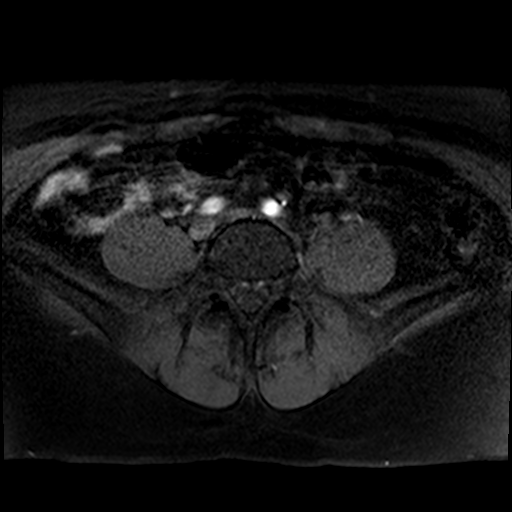

[Series 8: axial spgr post · axial · 6.0mm · 0.49mm/px · z∈[-101,-15]mm · 3 of 26 slices shown]
[im 1/26]
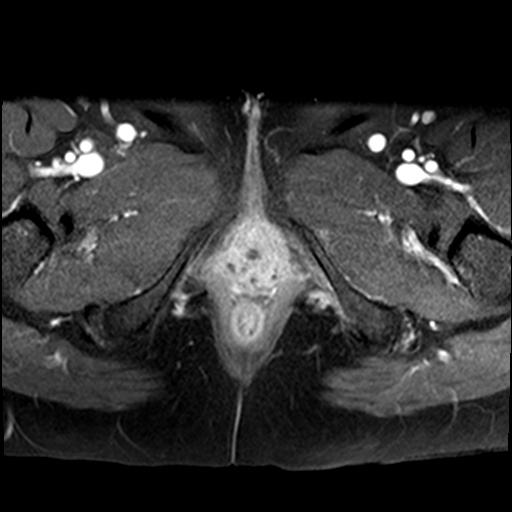
[im 7/26]
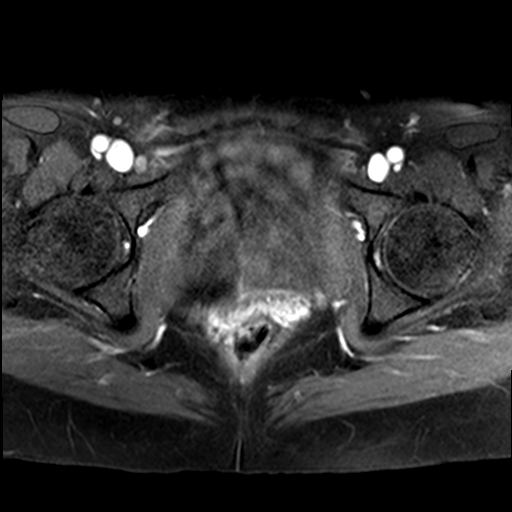
[im 13/26]
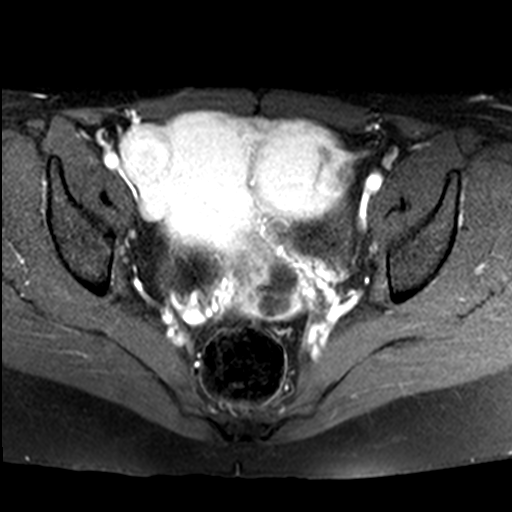

[32 of 48 positions shown; findings below may reference images not displayed]

FINDINGS: The uterus measures 9.3 x 5.5 x 5.2 cm and is anteverted and
anteflexed. Numerous fibroids are identified, with dominant fibroids
as follows:

Right uterine fundus, lateral, predominantly subserosal/
pedunculated, 3.2 x 3.0 x 3.0 cm. An adjacent similar-appearing
pedunculated subserosal fibroid is also identified measuring 1.8 x
1.7 x 1.3 cm.

Left lateral uterine body, intramural, 1.9 x 1.7 x 1.1 cm.

The right ovary appears normal. A small amount of right adnexal free
fluid is present. A tubular fluid intensity structure within the
right adnexa measures 2.5 x 1.3 x 0.8 cm, likely a small
hydrosalpinx.

Within the left ovary is a T2 hyperintense with partial mural
infolding and 1-2 possible internal thin septa, measuring 7.0 x
x 6.3 cm. This mass is T1 hyperintense and demonstrates no loss of
signal at fat saturated imaging. A few adjacent T2 hyperintense
ovarian follicles are noted at its periphery.

No pelvic lymphadenopathy is identified. Visualized bowel is
unremarkable. Osseous structures are unremarkable. Small
normal-appearing inguinal lymph nodes are incidentally noted.

After administration of contrast, apparent increase in signal
intensity within the left ovarian mass is most likely artifactual
pseudo enhancement, as no internal enhancing masslike area is
identified.
IMPRESSION: Left ovarian mass with imaging characteristics most typical for an
endometrioma. Hemorrhagic cyst is less likely given stability over
time and internal appearance. No suspicious feature is identified.
Followup pelvic ultrasound is recommended in 3-6 months.

The right ovary is normal. Previously identified solid-appearing
right adnexal mass corresponds to a dominant right lateral fundal
pedunculated subserosal fibroid.

Small right hydrosalpinx.

## 2017-06-14 ENCOUNTER — Other Ambulatory Visit: Payer: Self-pay | Admitting: Obstetrics & Gynecology

## 2017-06-14 NOTE — Telephone Encounter (Signed)
Medication refill request: detrol LA Last AEX:  05/26/16 SM Next AEX: none Last MMG (if hormonal medication request): 08/03/15 [email or web address]:neg  Refill authorized: 05/26/16 #90caps/4R.   Left voicemail to call back to schedule AEX. Today please advise.

## 2017-06-23 ENCOUNTER — Other Ambulatory Visit: Payer: Self-pay | Admitting: Obstetrics & Gynecology

## 2017-06-23 NOTE — Telephone Encounter (Signed)
Medication refill request: activella  Last AEX:  05/26/16 SM Next AEX: not scheduled  Last MMG (if hormonal medication request): 08/03/15 [email or web address]:neg  Refill authorized: 05/26/16 #90tabs/4R.   Second attempt to schedule pt for AEX. Left voicemail to call back to schedule AEX again today. First attempt to schedule was 06/14/17.  Please advise.   Routed to Dr. Quincy Simmonds

## 2017-06-23 NOTE — Telephone Encounter (Signed)
I need to defer this refill to Dr. Sabra Heck.  Cc- Dr. Sabra Heck

## 2017-06-28 NOTE — Telephone Encounter (Signed)
Spoke with patient in an attempt to schedule AEX (see previous messages), patient states that she was told by Dr. Sabra Heck that she no longer needs an AEX since having a hysterectomy. Patient states that she will be getting refills done with PCP.

## 2017-06-29 NOTE — Telephone Encounter (Signed)
Patient was advised that she could see her PCP as she's had a complete hysterectomy if she desired.  She was advised to continue with examinations but, again, her PCP could do this.  Declined refills.  Ok to close encounter.

## 2018-08-16 ENCOUNTER — Other Ambulatory Visit: Payer: Self-pay | Admitting: Family Medicine

## 2018-08-16 DIAGNOSIS — Z1231 Encounter for screening mammogram for malignant neoplasm of breast: Secondary | ICD-10-CM

## 2018-08-17 ENCOUNTER — Ambulatory Visit
Admission: RE | Admit: 2018-08-17 | Discharge: 2018-08-17 | Disposition: A | Discharge status: Home / Self Care | Payer: Managed Care, Other (non HMO) | Source: Ambulatory Visit | Attending: Family Medicine | Admitting: Family Medicine

## 2018-08-17 DIAGNOSIS — Z1231 Encounter for screening mammogram for malignant neoplasm of breast: Secondary | ICD-10-CM

## 2018-08-31 ENCOUNTER — Encounter: Payer: Self-pay | Admitting: Family Medicine

## 2018-09-27 ENCOUNTER — Encounter: Payer: Self-pay | Admitting: Dietician

## 2018-09-27 ENCOUNTER — Encounter: Payer: 59 | Attending: Family Medicine | Admitting: Dietician

## 2018-09-27 DIAGNOSIS — E119 Type 2 diabetes mellitus without complications: Secondary | ICD-10-CM | POA: Diagnosis not present

## 2018-09-27 NOTE — Progress Notes (Signed)
Patient was seen on 09/27/18 for the first of a series of three diabetes self-management courses at the [facility].  Patient Education Plan per assessed needs and concerns is to attend three course education program for Diabetes Self Management Education.  The following learning objectives were met by the patient during this class:  Describe diabetes  State some common risk factors for diabetes  Defines the role of glucose and insulin  Identifies type of diabetes and pathophysiology  Describe the relationship between diabetes and cardiovascular risk  State the members of the Healthcare Team  States the rationale for glucose monitoring  State when to test glucose  State their individual Target Range  State the importance of logging glucose readings  Describe how to interpret glucose readings  Identifies A1C target  Explain the correlation between A1c and eAG values  State symptoms and treatment of high blood glucose  State symptoms and treatment of low blood glucose  Explain proper technique for glucose testing  Identifies proper sharps disposal  Handouts given during class include:  ADA Diabetes You Take Control   Carb Counting and Meal Planning book  Meal Plan Card  Meal planning worksheet  Low Sodium Flavoring Tips  Types of Fats  The diabetes portion plate  N3Y to eAG Conversion Chart  Diabetes Recommended Care Schedule  Support Group  Diabetes Success Plan  Core Class Satisfaction Survey   Follow-Up Plan:  Attend core 2

## 2018-10-02 ENCOUNTER — Ambulatory Visit: Payer: Managed Care, Other (non HMO)

## 2018-10-04 ENCOUNTER — Encounter: Payer: Self-pay | Admitting: Dietician

## 2018-10-04 ENCOUNTER — Encounter: Payer: 59 | Admitting: Dietician

## 2018-10-04 DIAGNOSIS — E119 Type 2 diabetes mellitus without complications: Secondary | ICD-10-CM | POA: Diagnosis not present

## 2018-10-04 NOTE — Progress Notes (Signed)
Patient was seen on 10/04/18 for the second of a series of three diabetes self-management courses at the [facility]. The following learning objectives were met by the patient during this class:   Describe the role of different macronutrients on glucose  Explain how carbohydrates affect blood glucose  State what foods contain the most carbohydrates  Demonstrate carbohydrate counting  Demonstrate how to read Nutrition Facts food label  Describe effects of various fats on heart health  Describe the importance of good nutrition for health and healthy eating strategies  Describe techniques for managing your shopping, cooking and meal planning  List strategies to follow meal plan when dining out  Describe the effects of alcohol on glucose and how to use it safely  Goals:  Follow Diabetes Meal Plan as instructed  Aim to spread carbs evenly throughout the day  Aim for 3 meals per day and snacks as needed Include lean protein foods to meals/snacks  Monitor glucose levels as instructed by your doctor   Follow-Up Plan:  Attend Core 3  Work towards following your personal food plan.

## 2018-10-09 ENCOUNTER — Ambulatory Visit: Payer: Managed Care, Other (non HMO)

## 2018-10-11 ENCOUNTER — Encounter: Payer: 59 | Admitting: Dietician

## 2018-10-11 ENCOUNTER — Encounter: Payer: Self-pay | Admitting: Dietician

## 2018-10-11 DIAGNOSIS — E119 Type 2 diabetes mellitus without complications: Secondary | ICD-10-CM | POA: Diagnosis not present

## 2018-10-11 NOTE — Progress Notes (Signed)
Patient was seen on 10/11/18 for the third of a series of three diabetes self-management courses at the [facility].   Tracie Conway the amount of activity recommended for healthy living . Describe activities suitable for individual needs . Identify ways to regularly incorporate activity into daily life . Identify barriers to activity and ways to over come these barriers  Identify diabetes medications being personally used and their primary action for lowering glucose and possible side effects . Describe role of stress on blood glucose and develop strategies to address psychosocial issues . Identify diabetes complications and ways to prevent them  Explain how to manage diabetes during illness . Evaluate success in meeting personal goal . Establish 2-3 goals that they will plan to diligently work on  Goals:   I will count my carb choices at most meals and snacks  I will continue to add more veggies to my meals and snacks  I will be active 30 minutes or more 3 times a week  I will take my diabetes medications as scheduled  I will eat less unhealthy fats by eating less dairy products and reading Nutrition Labels for saturated and trans fats  I will test my glucose at least 3 times a day, 7 days a week  I will look at patterns in my record book at least 4 days a month  To help manage stress I will  exercise at least 3 times a week and take time for myself on a weekly basis  Your patient has identified these potential barriers to change:  Motivation  Your patient has identified their diabetes self-care support plan as  Family Support On-line Resources    Plan:  Attend Support Group as desired

## 2018-10-16 ENCOUNTER — Ambulatory Visit: Payer: Managed Care, Other (non HMO)

## 2023-03-01 ENCOUNTER — Other Ambulatory Visit: Payer: Self-pay | Admitting: Pain Medicine

## 2023-03-01 DIAGNOSIS — R222 Localized swelling, mass and lump, trunk: Secondary | ICD-10-CM

## 2023-03-06 ENCOUNTER — Other Ambulatory Visit: Payer: Self-pay | Admitting: Family Medicine

## 2023-03-06 DIAGNOSIS — Z1231 Encounter for screening mammogram for malignant neoplasm of breast: Secondary | ICD-10-CM

## 2023-03-08 ENCOUNTER — Ambulatory Visit: Payer: 59

## 2023-03-22 ENCOUNTER — Ambulatory Visit
Admission: RE | Admit: 2023-03-22 | Discharge: 2023-03-22 | Disposition: A | Discharge status: Home / Self Care | Payer: 59 | Source: Ambulatory Visit | Attending: Family Medicine | Admitting: Family Medicine

## 2023-03-22 DIAGNOSIS — Z1231 Encounter for screening mammogram for malignant neoplasm of breast: Secondary | ICD-10-CM

## 2023-03-23 ENCOUNTER — Ambulatory Visit
Admission: RE | Admit: 2023-03-23 | Discharge: 2023-03-23 | Disposition: A | Discharge status: Home / Self Care | Payer: 59 | Source: Ambulatory Visit | Attending: Pain Medicine | Admitting: Pain Medicine

## 2023-03-23 DIAGNOSIS — R222 Localized swelling, mass and lump, trunk: Secondary | ICD-10-CM

## 2023-03-28 ENCOUNTER — Other Ambulatory Visit: Payer: Self-pay | Admitting: Family Medicine

## 2023-03-28 DIAGNOSIS — R928 Other abnormal and inconclusive findings on diagnostic imaging of breast: Secondary | ICD-10-CM

## 2023-04-04 ENCOUNTER — Ambulatory Visit
Admission: RE | Admit: 2023-04-04 | Discharge: 2023-04-04 | Disposition: A | Discharge status: Home / Self Care | Payer: 59 | Source: Ambulatory Visit | Attending: Family Medicine | Admitting: Family Medicine

## 2023-04-04 ENCOUNTER — Ambulatory Visit: Payer: 59

## 2023-04-04 DIAGNOSIS — R928 Other abnormal and inconclusive findings on diagnostic imaging of breast: Secondary | ICD-10-CM

## 2024-07-30 ENCOUNTER — Other Ambulatory Visit: Payer: Self-pay | Admitting: Family Medicine

## 2024-07-30 DIAGNOSIS — Z1231 Encounter for screening mammogram for malignant neoplasm of breast: Secondary | ICD-10-CM

## 2024-08-08 ENCOUNTER — Other Ambulatory Visit: Payer: Self-pay | Admitting: Medical Genetics

## 2024-08-15 ENCOUNTER — Ambulatory Visit
Admission: RE | Admit: 2024-08-15 | Discharge: 2024-08-15 | Disposition: A | Discharge status: Home / Self Care | Source: Ambulatory Visit | Attending: Family Medicine | Admitting: Family Medicine

## 2024-08-15 DIAGNOSIS — Z1231 Encounter for screening mammogram for malignant neoplasm of breast: Secondary | ICD-10-CM

## 2024-10-18 ENCOUNTER — Other Ambulatory Visit: Payer: Self-pay | Admitting: Medical Genetics

## 2024-10-18 DIAGNOSIS — Z006 Encounter for examination for normal comparison and control in clinical research program: Secondary | ICD-10-CM

## 2024-10-22 ENCOUNTER — Other Ambulatory Visit (HOSPITAL_COMMUNITY)

## 2024-11-20 ENCOUNTER — Other Ambulatory Visit (HOSPITAL_COMMUNITY)
